# Patient Record
Sex: Male | Born: 1960 | Race: Black or African American | Hispanic: No | Smoking: Current some day smoker
Health system: Southern US, Community
[De-identification: ages and names within clinical notes are randomized; demographics above are authoritative.]

## PROBLEM LIST (undated history)

## (undated) DIAGNOSIS — D649 Anemia, unspecified: Secondary | ICD-10-CM

## (undated) DIAGNOSIS — I10 Essential (primary) hypertension: Secondary | ICD-10-CM

## (undated) DIAGNOSIS — Z8619 Personal history of other infectious and parasitic diseases: Secondary | ICD-10-CM

## (undated) DIAGNOSIS — N200 Calculus of kidney: Secondary | ICD-10-CM

## (undated) DIAGNOSIS — K219 Gastro-esophageal reflux disease without esophagitis: Secondary | ICD-10-CM

## (undated) DIAGNOSIS — I480 Paroxysmal atrial fibrillation: Secondary | ICD-10-CM

## (undated) HISTORY — DX: Personal history of other infectious and parasitic diseases: Z86.19

## (undated) HISTORY — DX: Calculus of kidney: N20.0

## (undated) HISTORY — DX: Anemia, unspecified: D64.9

## (undated) HISTORY — DX: Essential (primary) hypertension: I10

## (undated) HISTORY — PX: TONSILLECTOMY: SUR1361

## (undated) HISTORY — DX: Gastro-esophageal reflux disease without esophagitis: K21.9

## (undated) HISTORY — DX: Paroxysmal atrial fibrillation: I48.0

## (undated) HISTORY — PX: PENILE PROSTHESIS IMPLANT: SHX240

## (undated) HISTORY — PX: HERNIA REPAIR: SHX51

---

## 1998-03-26 ENCOUNTER — Inpatient Hospital Stay (HOSPITAL_COMMUNITY): Admission: EM | Admit: 1998-03-26 | Discharge: 1998-03-28 | Payer: Self-pay | Admitting: Emergency Medicine

## 1998-10-02 ENCOUNTER — Emergency Department (HOSPITAL_COMMUNITY): Admission: EM | Admit: 1998-10-02 | Discharge: 1998-10-02 | Payer: Self-pay | Admitting: Emergency Medicine

## 1998-11-27 ENCOUNTER — Encounter: Payer: Self-pay | Admitting: Emergency Medicine

## 1998-11-27 ENCOUNTER — Emergency Department (HOSPITAL_COMMUNITY): Admission: EM | Admit: 1998-11-27 | Discharge: 1998-11-27 | Payer: Self-pay | Admitting: Emergency Medicine

## 1998-12-13 ENCOUNTER — Emergency Department (HOSPITAL_COMMUNITY): Admission: EM | Admit: 1998-12-13 | Discharge: 1998-12-13 | Payer: Self-pay | Admitting: Emergency Medicine

## 1999-01-06 ENCOUNTER — Ambulatory Visit (HOSPITAL_BASED_OUTPATIENT_CLINIC_OR_DEPARTMENT_OTHER): Admission: RE | Admit: 1999-01-06 | Discharge: 1999-01-06 | Payer: Self-pay | Admitting: Surgery

## 1999-09-25 ENCOUNTER — Emergency Department (HOSPITAL_COMMUNITY): Admission: EM | Admit: 1999-09-25 | Discharge: 1999-09-25 | Payer: Self-pay | Admitting: Emergency Medicine

## 2000-09-19 ENCOUNTER — Encounter: Payer: Self-pay | Admitting: Emergency Medicine

## 2000-09-19 ENCOUNTER — Emergency Department (HOSPITAL_COMMUNITY): Admission: EM | Admit: 2000-09-19 | Discharge: 2000-09-19 | Payer: Self-pay | Admitting: Emergency Medicine

## 2000-10-14 ENCOUNTER — Encounter: Payer: Self-pay | Admitting: Emergency Medicine

## 2000-10-14 ENCOUNTER — Emergency Department (HOSPITAL_COMMUNITY): Admission: EM | Admit: 2000-10-14 | Discharge: 2000-10-14 | Payer: Self-pay | Admitting: Emergency Medicine

## 2002-06-23 ENCOUNTER — Observation Stay (HOSPITAL_COMMUNITY): Admission: EM | Admit: 2002-06-23 | Discharge: 2002-06-23 | Payer: Self-pay | Admitting: Emergency Medicine

## 2002-06-23 ENCOUNTER — Encounter: Payer: Self-pay | Admitting: Emergency Medicine

## 2003-06-11 ENCOUNTER — Emergency Department (HOSPITAL_COMMUNITY): Admission: EM | Admit: 2003-06-11 | Discharge: 2003-06-11 | Payer: Self-pay | Admitting: Emergency Medicine

## 2003-12-27 ENCOUNTER — Emergency Department (HOSPITAL_COMMUNITY): Admission: EM | Admit: 2003-12-27 | Discharge: 2003-12-27 | Payer: Self-pay | Admitting: Emergency Medicine

## 2004-05-16 ENCOUNTER — Inpatient Hospital Stay (HOSPITAL_COMMUNITY): Admission: EM | Admit: 2004-05-16 | Discharge: 2004-05-17 | Payer: Self-pay | Admitting: Emergency Medicine

## 2004-05-16 ENCOUNTER — Encounter (INDEPENDENT_AMBULATORY_CARE_PROVIDER_SITE_OTHER): Payer: Self-pay | Admitting: Cardiology

## 2005-12-19 ENCOUNTER — Ambulatory Visit (HOSPITAL_COMMUNITY): Admission: RE | Admit: 2005-12-19 | Discharge: 2005-12-20 | Payer: Self-pay | Admitting: Urology

## 2006-04-04 ENCOUNTER — Inpatient Hospital Stay (HOSPITAL_COMMUNITY): Admission: EM | Admit: 2006-04-04 | Discharge: 2006-04-05 | Payer: Self-pay | Admitting: Emergency Medicine

## 2008-03-17 ENCOUNTER — Inpatient Hospital Stay (HOSPITAL_COMMUNITY): Admission: EM | Admit: 2008-03-17 | Discharge: 2008-03-24 | Payer: Self-pay | Admitting: Emergency Medicine

## 2008-03-19 ENCOUNTER — Encounter (INDEPENDENT_AMBULATORY_CARE_PROVIDER_SITE_OTHER): Payer: Self-pay | Admitting: Internal Medicine

## 2009-01-09 IMAGING — CT CT HEAD W/O CM
2 series · 16 of 30 positions shown, 20 images · non-contrast
Comparison: None.

CLINICAL DATA: Chest pain, dizziness, unsteady gait

CT HEAD WITHOUT CONTRAST
TECHNIQUE: Contiguous axial images were obtained from the base of
the skull through the vertex without contrast.

[Series 2: brain · axial · 0.47mm/px · z∈[+149,+271]mm · 13 of 44 slices shown, 17 images]
[im 4/44  brain]
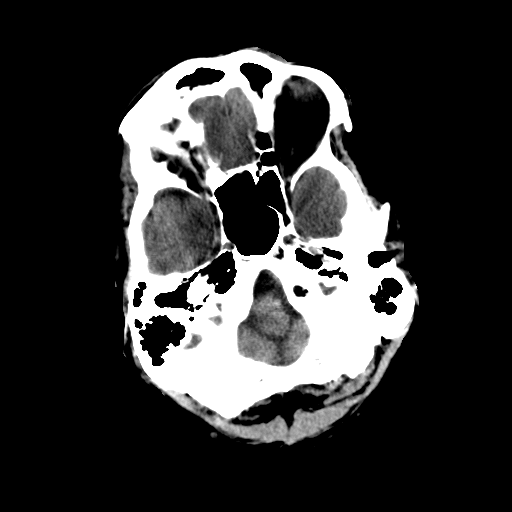
[im 4/44  bone]
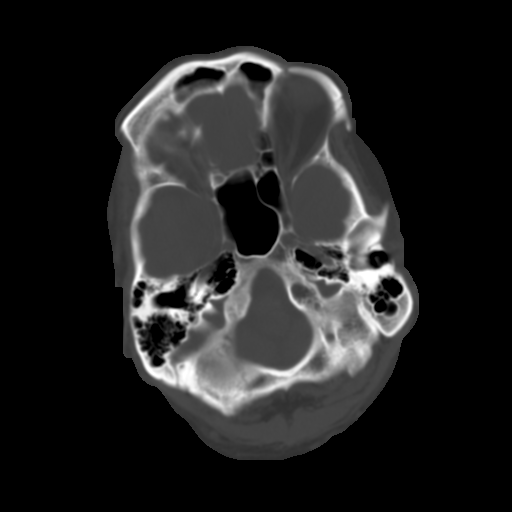
[im 7/44  brain]
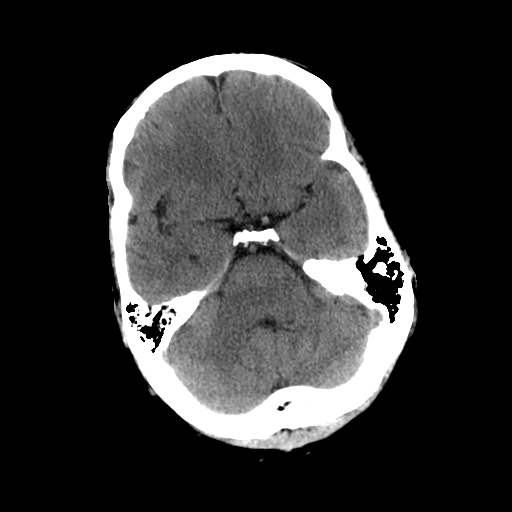
[im 10/44  brain]
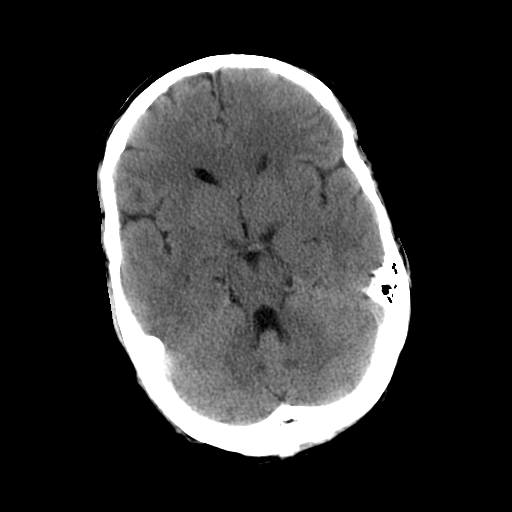
[im 13/44  brain]
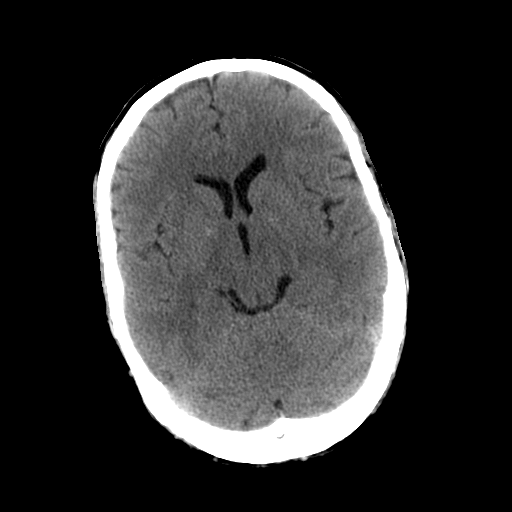
[im 16/44  brain]
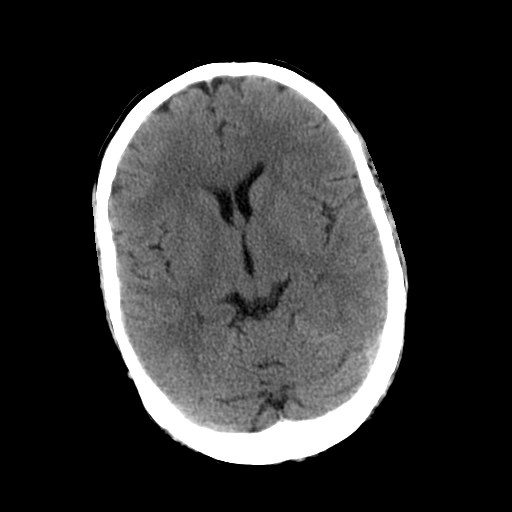
[im 16/44  bone]
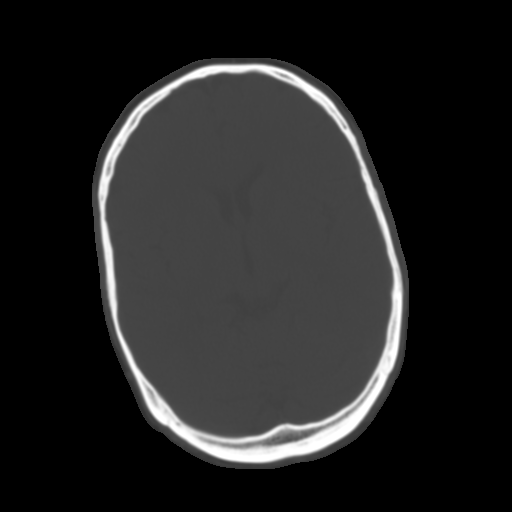
[im 19/44  brain]
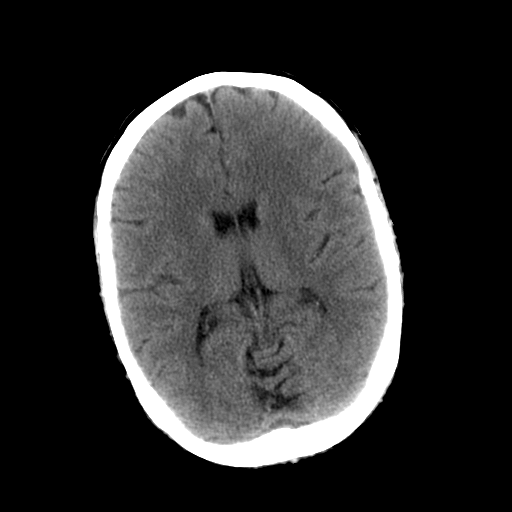
[im 22/44  brain]
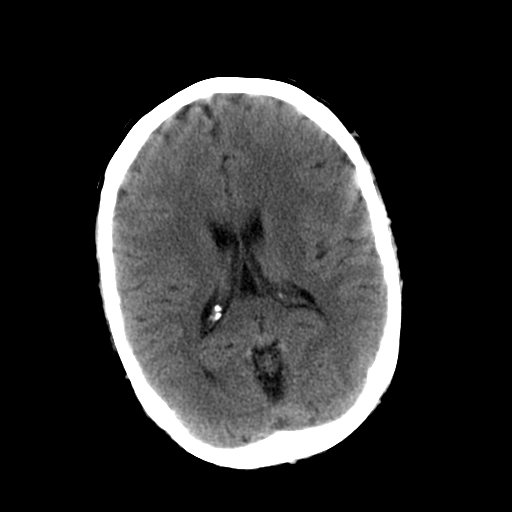
[im 25/44  brain]
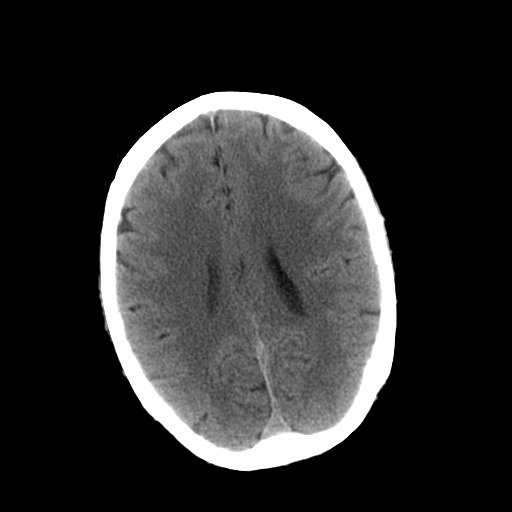
[im 28/44  brain]
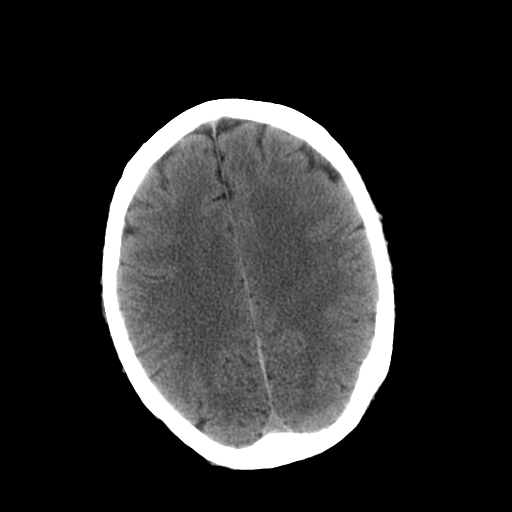
[im 28/44  bone]
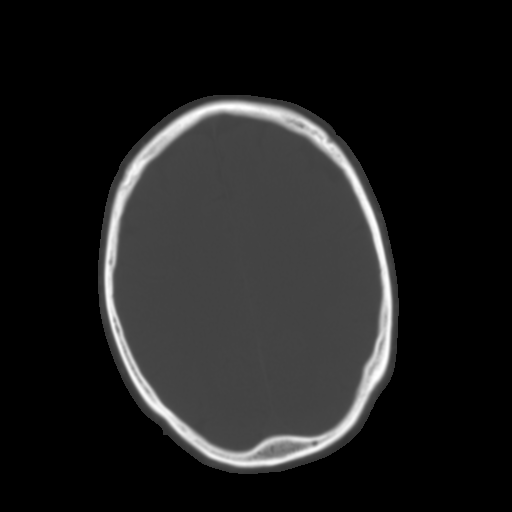
[im 31/44  brain]
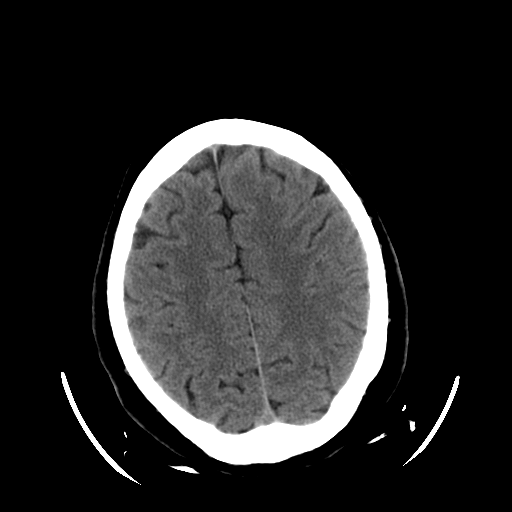
[im 34/44  brain]
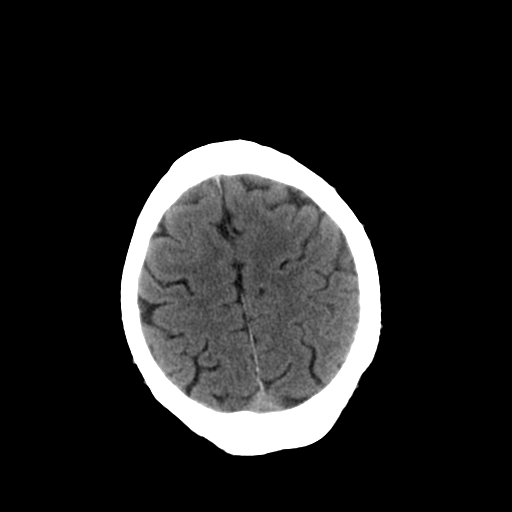
[im 37/44  brain]
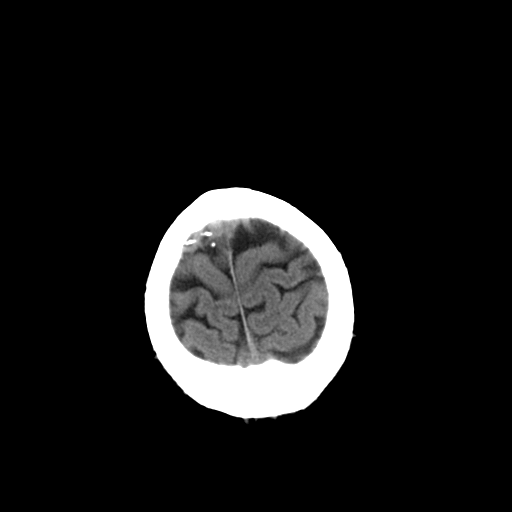
[im 40/44  brain]
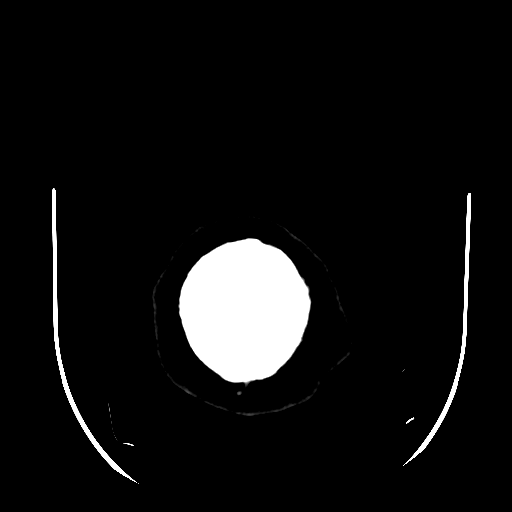
[im 40/44  bone]
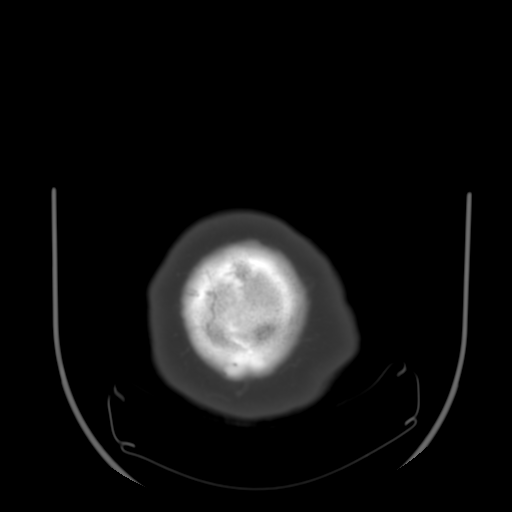

[Series 3: recon 2: brain · axial · 0.47mm/px · z∈[+149,+189]mm · 3 of 44 slices shown]
[im 4/44  brain]
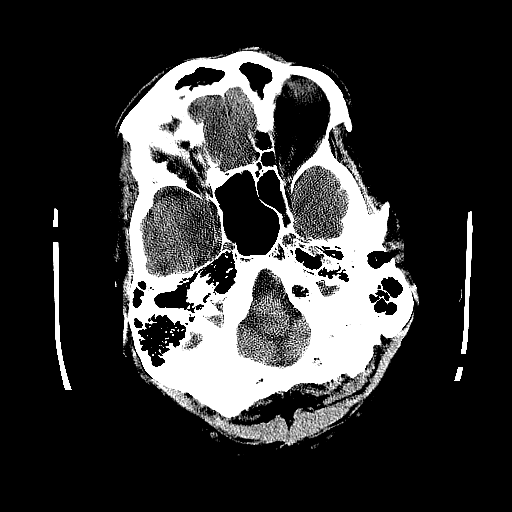
[im 10/44  brain]
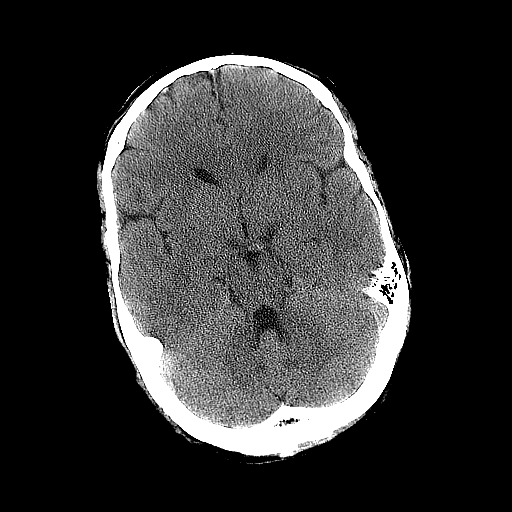
[im 16/44  brain]
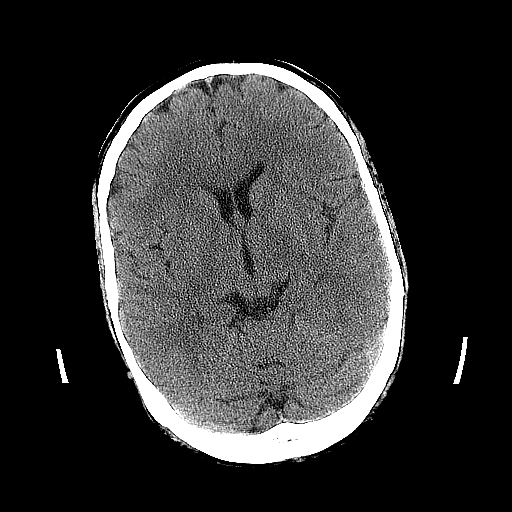

[16 of 30 positions shown; findings below may reference images not displayed]

FINDINGS: No acute intracranial hemorrhage, definite infarction,
mass lesion, midline shift, focal edema, hydrocephalus, herniation
or extra-axial fluid collection.  Gray and white matter
differentiation is maintained.  Cisterns are patent.  The mastoids
and sinuses are clear.
IMPRESSION: No acute intracranial finding.

## 2009-12-11 ENCOUNTER — Emergency Department (HOSPITAL_COMMUNITY): Admission: EM | Admit: 2009-12-11 | Discharge: 2009-12-11 | Payer: Self-pay | Admitting: Emergency Medicine

## 2010-06-26 ENCOUNTER — Observation Stay (HOSPITAL_COMMUNITY): Admission: EM | Admit: 2010-06-26 | Discharge: 2010-06-27 | Payer: Self-pay | Admitting: Emergency Medicine

## 2010-07-06 ENCOUNTER — Ambulatory Visit: Payer: Self-pay | Admitting: Internal Medicine

## 2010-07-06 ENCOUNTER — Encounter: Payer: Self-pay | Admitting: Gastroenterology

## 2010-07-06 DIAGNOSIS — I959 Hypotension, unspecified: Secondary | ICD-10-CM | POA: Insufficient documentation

## 2010-07-06 DIAGNOSIS — D509 Iron deficiency anemia, unspecified: Secondary | ICD-10-CM | POA: Insufficient documentation

## 2010-07-06 DIAGNOSIS — K921 Melena: Secondary | ICD-10-CM | POA: Insufficient documentation

## 2010-07-06 LAB — CONVERTED CEMR LAB
Basophils Absolute: 0.1 K/uL
Basophils Relative: 1 %
Cortisol, Plasma: 16 ug/dL
Eosinophils Absolute: 0.1 K/uL
Eosinophils Relative: 1.9 %
HCT: 36.7 % — ABNORMAL LOW
Hemoglobin: 12.5 g/dL — ABNORMAL LOW
Lymphocytes Relative: 31 %
Lymphs Abs: 1.9 K/uL
MCHC: 34.2 g/dL
MCV: 88.7 fL
Monocytes Absolute: 0.7 K/uL
Monocytes Relative: 11.7 %
Neutro Abs: 3.3 K/uL
Neutrophils Relative %: 54.4 %
PSA: 1.13 ng/mL
Platelets: 258 K/uL
RBC: 4.14 M/uL — ABNORMAL LOW
RDW: 16 % — ABNORMAL HIGH
WBC: 6.1 10*3/microliter

## 2010-07-09 ENCOUNTER — Ambulatory Visit: Payer: Self-pay | Admitting: Internal Medicine

## 2010-07-10 ENCOUNTER — Inpatient Hospital Stay (HOSPITAL_COMMUNITY): Admission: EM | Admit: 2010-07-10 | Discharge: 2010-07-11 | Payer: Self-pay | Admitting: Emergency Medicine

## 2010-07-10 ENCOUNTER — Encounter (INDEPENDENT_AMBULATORY_CARE_PROVIDER_SITE_OTHER): Payer: Self-pay | Admitting: Cardiology

## 2010-07-26 ENCOUNTER — Encounter (INDEPENDENT_AMBULATORY_CARE_PROVIDER_SITE_OTHER): Payer: Self-pay | Admitting: *Deleted

## 2010-07-31 ENCOUNTER — Ambulatory Visit: Payer: Self-pay | Admitting: Gastroenterology

## 2010-08-09 ENCOUNTER — Telehealth (INDEPENDENT_AMBULATORY_CARE_PROVIDER_SITE_OTHER): Payer: Self-pay | Admitting: *Deleted

## 2010-08-14 ENCOUNTER — Ambulatory Visit: Payer: Self-pay | Admitting: Gastroenterology

## 2010-08-14 LAB — HM COLONOSCOPY: HM Colonoscopy: NORMAL

## 2010-08-21 ENCOUNTER — Telehealth: Payer: Self-pay | Admitting: Gastroenterology

## 2010-08-21 ENCOUNTER — Encounter: Payer: Self-pay | Admitting: Gastroenterology

## 2010-09-08 ENCOUNTER — Encounter (INDEPENDENT_AMBULATORY_CARE_PROVIDER_SITE_OTHER): Payer: Self-pay | Admitting: *Deleted

## 2010-09-12 ENCOUNTER — Ambulatory Visit: Payer: Self-pay | Admitting: Gastroenterology

## 2010-09-19 ENCOUNTER — Encounter: Payer: Self-pay | Admitting: Internal Medicine

## 2010-09-19 ENCOUNTER — Ambulatory Visit (HOSPITAL_COMMUNITY): Admission: RE | Admit: 2010-09-19 | Discharge: 2010-09-19 | Payer: Self-pay | Admitting: Gastroenterology

## 2010-09-19 ENCOUNTER — Ambulatory Visit: Payer: Self-pay | Admitting: Gastroenterology

## 2010-09-20 ENCOUNTER — Telehealth: Payer: Self-pay | Admitting: Gastroenterology

## 2010-09-25 ENCOUNTER — Telehealth: Payer: Self-pay | Admitting: Internal Medicine

## 2010-09-26 ENCOUNTER — Ambulatory Visit: Payer: Self-pay | Admitting: Internal Medicine

## 2010-09-26 DIAGNOSIS — B3781 Candidal esophagitis: Secondary | ICD-10-CM | POA: Insufficient documentation

## 2010-09-26 LAB — CONVERTED CEMR LAB
Albumin: 3.7 g/dL (ref 3.5–5.2)
Alkaline Phosphatase: 49 units/L (ref 39–117)
Eosinophils Absolute: 0.1 10*3/uL (ref 0.0–0.7)
HCT: 36.3 % — ABNORMAL LOW (ref 39.0–52.0)
Hemoglobin: 12.3 g/dL — ABNORMAL LOW (ref 13.0–17.0)
Lymphocytes Relative: 21.3 % (ref 12.0–46.0)
Neutrophils Relative %: 66.7 % (ref 43.0–77.0)
Total Protein: 6.7 g/dL (ref 6.0–8.3)
WBC: 7.6 10*3/uL (ref 4.5–10.5)

## 2010-09-27 ENCOUNTER — Encounter: Payer: Self-pay | Admitting: Internal Medicine

## 2010-10-02 ENCOUNTER — Encounter: Payer: Self-pay | Admitting: Gastroenterology

## 2010-10-05 ENCOUNTER — Ambulatory Visit: Payer: Self-pay | Admitting: Gastroenterology

## 2010-10-05 LAB — CONVERTED CEMR LAB
OCCULT 2: NEGATIVE
OCCULT 3: NEGATIVE
OCCULT 4: NEGATIVE

## 2010-10-09 ENCOUNTER — Telehealth: Payer: Self-pay | Admitting: Gastroenterology

## 2010-10-18 ENCOUNTER — Telehealth: Payer: Self-pay | Admitting: Gastroenterology

## 2010-10-19 ENCOUNTER — Ambulatory Visit: Payer: Self-pay | Admitting: Gastroenterology

## 2010-10-19 LAB — CONVERTED CEMR LAB
Basophils Absolute: 0.1 10*3/uL (ref 0.0–0.1)
Eosinophils Relative: 1.8 % (ref 0.0–5.0)
Hemoglobin: 12.3 g/dL — ABNORMAL LOW (ref 13.0–17.0)
Lymphocytes Relative: 27.2 % (ref 12.0–46.0)
Lymphs Abs: 1.7 10*3/uL (ref 0.7–4.0)
MCHC: 34 g/dL (ref 30.0–36.0)
Monocytes Relative: 12.9 % — ABNORMAL HIGH (ref 3.0–12.0)
Platelets: 203 10*3/uL (ref 150.0–400.0)
RDW: 15.4 % — ABNORMAL HIGH (ref 11.5–14.6)

## 2010-10-30 ENCOUNTER — Emergency Department (HOSPITAL_COMMUNITY)
Admission: EM | Admit: 2010-10-30 | Discharge: 2010-10-30 | Payer: Self-pay | Source: Home / Self Care | Admitting: Emergency Medicine

## 2010-11-09 ENCOUNTER — Ambulatory Visit
Admission: RE | Admit: 2010-11-09 | Discharge: 2010-11-09 | Payer: Self-pay | Source: Home / Self Care | Attending: Gastroenterology | Admitting: Gastroenterology

## 2010-11-28 NOTE — Progress Notes (Signed)
Summary: Direct EGD/Enteroscopy  Phone Note Call from Patient Call back at Home Phone [phone]   Caller: Patient Call For: Dr. Arlyce Dice Reason for Call: Talk to Nurse Summary of Call: Wants to know if he can sch'd Endo directly? Initial call taken by: Karna Christmas,  August 21, 2010 10:13 AM  Follow-up for Phone Call        Endo with Entreroscopy scheduled for 09/19/2010 at 12:30pm. Previsit scheduled on 09/12/2010 at 2pm. Follow-up by: Merri Ray CMA (AAMA),  August 21, 2010 11:09 AM

## 2010-11-28 NOTE — Procedures (Signed)
Summary: Colonoscopy  Patient: [name] Note: All result statuses are Final unless otherwise noted.  Tests: (1) Colonoscopy (COL)   COL Colonoscopy           DONE     [facility]     [address]     [place], [place]  [address]           COLONOSCOPY PROCEDURE REPORT           PATIENT:  Roy, Lyons  MR#:  [id number]     BIRTHDATE:  May 21, 1961, 49 yrs. old  GENDER:  male           ENDOSCOPIST:  Roy Hair. Arlyce Dice, MD     Referred by:  [name], M.D.           PROCEDURE DATE:  08/14/2010     PROCEDURE:  Diagnostic Colonoscopy     ASA CLASS:  Class II     INDICATIONS:  1) heme positive stool  2) Iron deficiency anemia           MEDICATIONS:   Fentanyl 75 mcg IV, Versed 7 mg IV           DESCRIPTION OF PROCEDURE:   After the risks benefits and     alternatives of the procedure were thoroughly explained, informed     consent was obtained.  Digital rectal exam was performed and     revealed no abnormalities.   The LB 180AL [id number] endoscope was     introduced through the anus and advanced to the cecum, which was     identified by both the appendix and ileocecal valve, without     limitations.  The quality of the prep was excellent, using     MiraLax.  The instrument was then slowly withdrawn as the colon     was fully examined.     <<PROCEDUREIMAGES>>           FINDINGS:  A normal appearing cecum, ileocecal valve, and     appendiceal orifice were identified. The ascending, hepatic     flexure, transverse, splenic flexure, descending, sigmoid colon,     and rectum appeared unremarkable (see image1, image2, image3,     image8, and image9).   Retroflexed views in the rectum revealed no     abnormalities.    The time to cecum =  3.50  minutes. The scope     was then withdrawn (time =  7.0  min) from the patient and the     procedure completed.           COMPLICATIONS:  None           ENDOSCOPIC IMPRESSION:     1) Normal colon     RECOMMENDATIONS:     1)  Upper endoscopy/enteroscopy  will be scheduled for     unexplained Fe deficiency anemia and heme positive stool           REPEAT EXAM:  In 10 year(s) for Colonoscopy.           ______________________________     Roy Hair. Arlyce Dice, MD           CC:           n.     eSIGNED:   Barbette Hair. Lyons at 08/14/2010 02:08 PM           [name], [id number]  Note: An exclamation mark (!) indicates a result that was not dispersed  into the flowsheet. Document Creation Date: 08/14/2010 2:09 PM _______________________________________________________________________  (1) Order result status: Final Collection or observation date-time: 08/14/2010 14:05 Requested date-time:  Receipt date-time:  Reported date-time:  Referring Physician:   Ordering Physician: [name] [phone]) Specimen Source:  Source: [name] Order Number: [id number] Lab site:   Appended Document: Colonoscopy    Clinical Lists Changes  Observations: Added new observation of COLONNXTDUE: 07/2020 (08/14/2010 15:40)      Appended Document: Colonoscopy EGD/SMALL BOWEL ENTEROSCOPY SCHEDULED FOR 09/19/2010 AT 12:30.  PREVISIT 09/12/2010 AT 2PM

## 2010-11-28 NOTE — Letter (Signed)
Summary: Moviprep Instructions  [facility]  [address]   [place], [place] [address]   Phone: [phone]  Fax: [phone]       [name]    06/10/1961    MRN: [id number]        Procedure Day Dorna Bloom: Monday, 08-14-10     Arrival Time: 12:30 p.m.      Procedure Time: 1:30 p.m.     Location of Procedure:                    x   [facility] (4th Floor)   PREPARATION FOR COLONOSCOPY WITH MOVIPREP   Starting 5 days prior to your procedure 08-09-10 do not eat nuts, seeds, popcorn, corn, beans, peas,  salads, or any raw vegetables.  Do not take any fiber supplements (e.g. Metamucil, Citrucel, and Benefiber).  THE DAY BEFORE YOUR PROCEDURE         DATE:  08-13-10   DAY: Sunday  1.  Drink clear liquids the entire day-NO SOLID FOOD  2.  Do not drink anything colored red or purple.  Avoid juices with pulp.  No orange juice.  3.  Drink at least 64 oz. (8 glasses) of fluid/clear liquids during the day to prevent dehydration and help the prep work efficiently.  CLEAR LIQUIDS INCLUDE: Water Jello Ice Popsicles Tea (sugar ok, no milk/cream) Powdered fruit flavored drinks Coffee (sugar ok, no milk/cream) Gatorade Juice: apple, white grape, white cranberry  Lemonade Clear bullion, consomm, broth Carbonated beverages (any kind) Strained chicken noodle soup Hard Candy                             4.  In the morning, mix first dose of MoviPrep solution:    Empty 1 Pouch A and 1 Pouch B into the disposable container    Add lukewarm drinking water to the top line of the container. Mix to dissolve    Refrigerate (mixed solution should be used within 24 hrs)  5.  Begin drinking the prep at 5:00 p.m. The MoviPrep container is divided by 4 marks.   Every 15 minutes drink the solution down to the next mark (approximately 8 oz) until the full liter is complete.   6.  Follow completed prep with 16 oz of clear liquid of your choice (Nothing red or  purple).  Continue to drink clear liquids until bedtime.  7.  Before going to bed, mix second dose of MoviPrep solution:    Empty 1 Pouch A and 1 Pouch B into the disposable container    Add lukewarm drinking water to the top line of the container. Mix to dissolve    Refrigerate  THE DAY OF YOUR PROCEDURE      DATE: 08-14-10  DAY: Monday  Beginning at 8:30 a.m. (5 hours before procedure):         1. Every 15 minutes, drink the solution down to the next mark (approx 8 oz) until the full liter is complete.  2. Follow completed prep with 16 oz. of clear liquid of your choice.    3. You may drink clear liquids until 11:30 a.m.  (2 HOURS BEFORE PROCEDURE).   MEDICATION INSTRUCTIONS  Unless otherwise instructed, you should take regular prescription medications with a small sip of water   as early as possible the morning of your procedure.   Additional medication instructions: Do not take Iron supplement 5 days before procedure.  OTHER INSTRUCTIONS  You will need a responsible adult at least 51 years of age to accompany you and drive you home.   This person must remain in the waiting room during your procedure.  Wear loose fitting clothing that is easily removed.  Leave jewelry and other valuables at home.  However, you may wish to bring a book to read or  an iPod/MP3 player to listen to music as you wait for your procedure to start.  Remove all body piercing jewelry and leave at home.  Total time from sign-in until discharge is approximately 2-3 hours.  You should go home directly after your procedure and rest.  You can resume normal activities the  day after your procedure.  The day of your procedure you should not:   Drive   Make legal decisions   Operate machinery   Drink alcohol   Return to work  You will receive specific instructions about eating, activities and medications before you leave.    The above instructions have been reviewed and  explained to me by   Ezra Sites RN  July 31, 2010 3:51 PM     I fully understand and can verbalize these instructions _____________________________ Date _________

## 2010-11-28 NOTE — Letter (Signed)
Summary: Pre Visit Letter Revised  [facility]  [address] [place], [place] [address]   Phone: [phone]  Fax: [phone]        07/06/2010 MRN: [id number] [name] [address] [place], [place]  [address]             Procedure Date:  Oct 17 at 1:30 pm   Welcome to the Gastroenterology Division at [facility].    You are scheduled to see a nurse for your pre-procedure visit on Jul 31, 2010 at 3:30pm on the 3rd floor at [facility], [address]  We ask that you try to arrive at our office 15 minutes prior to your appointment time to allow for check-in.  Please take a minute to review the attached form.  If you answer "Yes" to one or more of the questions on the first page, we ask that you call the person listed at your earliest opportunity.  If you answer "No" to all of the questions, please complete the rest of the form and bring it to your appointment.    Your nurse visit will consist of discussing your medical and surgical history, your immediate family medical history, and your medications.   If you are unable to list all of your medications on the form, please bring the medication bottles to your appointment and we will list them.  We will need to be aware of both prescribed and over the counter drugs.  We will need to know exact dosage information as well.    Please be prepared to read and sign documents such as consent forms, a financial agreement, and acknowledgement forms.  If necessary, and with your consent, a friend or relative is welcome to sit-in on the nurse visit with you.  Please bring your insurance card so that we may make a copy of it.  If your insurance requires a referral to see a specialist, please bring your referral form from your primary care physician.  No co-pay is required for this nurse visit.     If you cannot keep your appointment, please call [phone] to cancel or reschedule prior to your appointment date.   This allows [place] the opportunity to schedule an appointment for another patient in need of care.    Thank you for choosing [facility] for your medical needs.  We appreciate the opportunity to care for you.  Please visit [place] at our website  to learn more about our practice.  Sincerely, The Gastroenterology Division

## 2010-11-28 NOTE — Letter (Signed)
Summary: EGD Instructions  [facility]  [address] [place], [place] [address]   Phone: [phone]  Fax: [phone]       [name]    1961/05/07    MRN: [id number]       Procedure Day /Date: Tuesday 09-19-10      Arrival Time:  11:30 p.m.     Procedure Time: 12:30 p.m.     Location of Procedure:                     _ x _ [facility] ( Outpatient Registration)    PREPARATION FOR ENDOSCOPY   On Tuesday 09-19-10 , THE DAY OF THE PROCEDURE:  1.   No solid foods, milk or milk products are allowed after midnight the night before your procedure.  2.   Do not drink anything colored red or purple.  Avoid juices with pulp.  No orange juice.  3.  You may drink clear liquids until 8:30 a.m. , which is 4 hours before your procedure.                                                                                                CLEAR LIQUIDS INCLUDE: Water Jello Ice Popsicles Tea (sugar ok, no milk/cream) Powdered fruit flavored drinks Coffee (sugar ok, no milk/cream) Gatorade Juice: apple, white grape, white cranberry  Lemonade Clear bullion, consomm, broth Carbonated beverages (any kind) Strained chicken noodle soup Hard Candy   MEDICATION INSTRUCTIONS  Unless otherwise instructed, you should take regular prescription medications with a small sip of water as early as possible the morning of your procedure.   Additional medication instructions: Stop taking Iron 5 days before procedure.             OTHER INSTRUCTIONS  You will need a responsible adult at least 51 years of age to accompany you and drive you home.   This person must remain in the waiting room during your procedure.  Wear loose fitting clothing that is easily removed.  Leave jewelry and other valuables at home.  However, you may wish to bring a book to read or an iPod/MP3 player to listen to music as you wait for your procedure to start.  Remove all body piercing  jewelry and leave at home.  Total time from sign-in until discharge is approximately 2-3 hours.  You should go home directly after your procedure and rest.  You can resume normal activities the day after your procedure.  The day of your procedure you should not:   Drive   Make legal decisions   Operate machinery   Drink alcohol   Return to work  You will receive specific instructions about eating, activities and medications before you leave.    The above instructions have been reviewed and explained to me by   Ezra Sites RN  September 12, 2010 2:15 PM    I fully understand and can verbalize these instructions _____________________________ Date _________

## 2010-11-28 NOTE — Miscellaneous (Signed)
  Clinical Lists Changes  Medications: Added new medication of NEXIUM 40 MG CPDR (ESOMEPRAZOLE MAGNESIUM) take 1 tab 1/2 before breakfrast - Signed Rx of NEXIUM 40 MG CPDR (ESOMEPRAZOLE MAGNESIUM) take 1 tab 1/2 before breakfrast;  #30 x 2;  Signed;  Entered by: Louis Meckel MD;  Authorized by: Louis Meckel MD;  Method [facility]: [facility]  [address] [facility] [address] [phone]*, [address], [place], [place], [place]  [id number], Ph: [phone] or [phone], Fax: [phone]    Prescriptions: NEXIUM 40 MG CPDR (ESOMEPRAZOLE MAGNESIUM) take 1 tab 1/2 before breakfrast  #30 x 2   Entered and Authorized by:   Louis Meckel MD   Signed by:   Louis Meckel MD on 09/19/2010   Method used:   [facility]        [facility]  [address] [phone]* (retail)       [address]       [place], [place]  [id number]       Ph: [phone] or [phone]       Fax: [phone]   RxID:   [id number]

## 2010-11-28 NOTE — Progress Notes (Signed)
Summary: procedure f/u ?'s  Phone Note Call from Patient Call back at Home Phone [phone]   Caller: Patient Call For: Dr. Arlyce Dice Reason for Call: Talk to Nurse Summary of Call: has ?'s regarding yesterday's EGD Initial call taken by: Vallarie Mare,  September 20, 2010 8:42 AM  Follow-up for Phone Call        Left message to call back Selinda Michaels RN  September 20, 2010 12:34 PM  Additional Follow-up for Phone Call Additional follow up Details #1::        Pt needs 4 week office appointment follow up and CBC drawn in 4 weeks and hemoccults need to be done. Waiting on pt to return [place] call Additional Follow-up by: Merri Ray CMA Duncan Dull),  September 20, 2010 1:21 PM    Additional Follow-up for Phone Call Additional follow up Details #2::    Spoke with pt, his follow upo appointment is scheduled on 11/09/2010 at 10:15am, he is to come into the basement for labs on 10/19/2010. Will mail out hemoccult cards today for pt, Went over instructions with pt. Follow-up by: Merri Ray CMA Duncan Dull),  September 25, 2010 8:54 AM   Appended Document: procedure f/u ?'s Pt stated he was having no problems, explained to pt to call back if he has any further concerns or questions

## 2010-11-28 NOTE — Letter (Signed)
Summary: New Patient letter  [facility]  [address] [place], [place] [address]   Phone: [phone]  Fax: [phone]       10/02/2010 MRN: [id number]  [name] [address] [place], [place]  [address]  Dear [name],  Welcome to the Gastroenterology Division at [facility].    You are scheduled to see Dr.  Arlyce Dice on 11-09-10 at 10:15am on the 3rd floor at [facility], [address].  We ask that you try to arrive at our office 15 minutes prior to your appointment time to allow for check-in.  We would like you to complete the enclosed self-administered evaluation form prior to your visit and bring it with you on the day of your appointment.  We will review it with you.  Also, please bring a complete list of all your medications or, if you prefer, bring the medication bottles and we will list them.  Please bring your insurance card so that we may make a copy of it.  If your insurance requires a referral to see a specialist, please bring your referral form from your primary care physician.  Co-payments are due at the time of your visit and may be paid by cash, check or credit card.     Your office visit will consist of a consult with your physician (includes a physical exam), any laboratory testing he/she may order, scheduling of any necessary diagnostic testing (e.g. x-ray, ultrasound, CT-scan), and scheduling of a procedure (e.g. Endoscopy, Colonoscopy) if required.  Please allow enough time on your schedule to allow for any/all of these possibilities.    If you cannot keep your appointment, please call [phone] to cancel or reschedule prior to your appointment date.  This allows [place] the opportunity to schedule an appointment for another patient in need of care.  If you do not cancel or reschedule by 5 p.m. the business day prior to your appointment date, you will be charged a $50.00 late cancellation/no-show fee.    Thank you for choosing [facility] for your medical needs.  We appreciate the opportunity to care for you.  Please visit [place] at our website  to learn more about our practice.                     Sincerely,                                                             The Gastroenterology Division

## 2010-11-28 NOTE — Progress Notes (Signed)
Summary: Questions about prep  Phone Note Call from Patient Call back at Home Phone [phone]   Caller: Patient Call For: Dr. Arlyce Dice Reason for Call: Talk to Nurse Summary of Call: should pt. stop his iron supplement and vitamins Initial call taken by: Karna Christmas,  August 09, 2010 9:52 AM  Follow-up for Phone Call        Phone Call Completed Follow-up by: Wyona Almas RN,  August 09, 2010 10:27 AM

## 2010-11-28 NOTE — Miscellaneous (Signed)
Summary: [facility] PV  Clinical Lists Changes  Medications: Added new medication of MOVIPREP 100 GM  SOLR (PEG-KCL-NACL-NASULF-NA ASC-C) As per prep instructions. - Signed Rx of MOVIPREP 100 GM  SOLR (PEG-KCL-NACL-NASULF-NA ASC-C) As per prep instructions.;  #1 x 0;  Signed;  Entered by: Ezra Sites RN;  Authorized by: Louis Meckel MD;  Method used: [facility] [facility] [address] [phone]*, [address] [place], [place], [place]  [id number], Ph: [phone] or [phone], Fax: [phone] Observations: Added new observation of NKA: T (07/31/2010 14:52)    Prescriptions: MOVIPREP 100 GM  SOLR (PEG-KCL-NACL-NASULF-NA ASC-C) As per prep instructions.  #1 x 0   Entered by:   Ezra Sites RN   Authorized by:   Louis Meckel MD   Signed by:   Ezra Sites RN on 07/31/2010   Method used:   Electronically to        [facility]  [address] [phone]* (retail)       [address]       [place], [place]  [id number]       Ph: [phone] or [phone]       Fax: [phone]   RxID:   [id number]

## 2010-11-28 NOTE — Miscellaneous (Signed)
Summary: [facility] PV  Clinical Lists Changes  Observations: Added new observation of NKA: T (09/12/2010 14:05)

## 2010-11-28 NOTE — Assessment & Plan Note (Signed)
Summary: follow up per Dr Yetta Barre   Vital Signs:  Patient profile:   50 year old male Height:      72.5 inches Weight:      221 pounds BMI:     29.67 O2 Sat:      98 % on Room air Temp:     98.4 degrees F oral Pulse rate:   66 / minute Pulse rhythm:   regular Resp:     16 per minute BP sitting:   128 / 86  (left arm) Cuff size:   large  Vitals Entered By: Rock Nephew CMA (September 26, 2010 8:44 AM)  Nutrition Counseling: Patient's BMI is greater than 25 and therefore counseled on weight management options.  O2 Flow:  Room air CC: follow-up visit Is Patient Diabetic? No Pain Assessment Patient in pain? no       Does patient need assistance? Functional Status Self care Ambulation Normal   Primary Care Provider:  Etta Grandchild MD  CC:  follow-up visit.  History of Present Illness: He returns for f/up and was found to have candida eso on his EGD so he needs to have an HIV test done. He denies any HIV risk factors and says that he has never been tested for HIV . He feels well today.  Preventive Screening-Counseling & Management  Alcohol-Tobacco     Alcohol drinks/day: 1glass wine monthly     >5/day in last 3 mos: no     Alcohol Counseling: not indicated; use of alcohol is not excessive or problematic     Feels need to cut down: no     Feels annoyed by complaints: no     Feels guilty re: drinking: no     Needs 'eye opener' in am: no     Smoking Status: current     Smoking Cessation Counseling: yes     Smoke Cessation Stage: precontemplative     Packs/Day: 0.25     Cigars/week: occasional     Tobacco Counseling: to quit use of tobacco products  Hep-HIV-STD-Contraception     Hepatitis Risk: no risk noted     HIV Risk: no risk noted     STD Risk: no risk noted     Dental Visit-last 6 months yes     Dental Care Counseling: to seek dental care; no dental care within six months     TSE monthly: yes     Testicular SE Education/Counseling to perform regular  STE      Sexual History:  currently monogamous.        Drug Use:  never.        Blood Transfusions:  no.    Clinical Review Panels:  Prevention   Last Colonoscopy:  DONE (08/14/2010)   Last PSA:  1.13 (07/06/2010)  CBC   WBC:  6.1 (07/06/2010)   RBC:  4.14 (07/06/2010)   Hgb:  12.5 (07/06/2010)   Hct:  36.7 (07/06/2010)   Platelets:  258.0 (07/06/2010)   MCV  88.7 (07/06/2010)   MCHC  34.2 (07/06/2010)   RDW  16.0 (07/06/2010)   PMN:  54.4 (07/06/2010)   Lymphs:  31.0 (07/06/2010)   Monos:  11.7 (07/06/2010)   Eosinophils:  1.9 (07/06/2010)   Basophil:  1.0 (07/06/2010)   Medications Prior to Update: 1)  Ferretts 325 (106 Fe) Mg Tabs (Ferrous Fumarate) .... Take 1 Tablet By Mouth Two Times A Day. 2)  Aspirin 81 Mg Tabs (Aspirin) .... Take 1 Tablet By Mouth Once A  Day. 3)  Fish Oil 1000 Mg Caps (Omega-3 Fatty Acids) .... Take 1 Capsule By Mouth Once A Day. 4)  Daily Multiple Vitamins  Tabs (Multiple Vitamin) .... Take 1 Tablet By Mouth Once A Day. 5)  Nexium 40 Mg Cpdr (Esomeprazole Magnesium) .... Take 1 Tab 1/2 Before Breakfrast 6)  Fluconazole 100 Mg Tabs (Fluconazole) .... Take 2 By Mouth On Day One Then Take 1 By Mouth Once Daily For 10 Days  Current Medications (verified): 1)  Ferretts 325 (106 Fe) Mg Tabs (Ferrous Fumarate) .... Take 1 Tablet By Mouth Two Times A Day. 2)  Aspirin 81 Mg Tabs (Aspirin) .... Take 1 Tablet By Mouth Once A Day. 3)  Fish Oil 1000 Mg Caps (Omega-3 Fatty Acids) .... Take 1 Capsule By Mouth Once A Day. 4)  Daily Multiple Vitamins  Tabs (Multiple Vitamin) .... Take 1 Tablet By Mouth Once A Day. 5)  Nexium 40 Mg Cpdr (Esomeprazole Magnesium) .... Take 1 Tab 1/2 Before Breakfrast 6)  Fluconazole 100 Mg Tabs (Fluconazole) .... Take 2 By Mouth On Day One Then Take 1 By Mouth Once Daily For 10 Days  Allergies (verified): No Known Drug Allergies  Past History:  Past Medical History: Last updated: 07/06/2010 hypotension Anemia-iron  deficiency  Past Surgical History: Last updated: 07/06/2010 Tonsillectomy  Family History: Last updated: 07/06/2010 Mother--living, diabetes, stroke, heart disease (bypass) Father--living, diabetes, ?cancer 1 sister--diabetes 1 brother-- alive and well  Social History: Last updated: 07/06/2010 Occupation: pastor Single Current Smoker Alcohol use-yes Regular exercise-yes  Risk Factors: Alcohol Use: 1glass wine monthly (09/26/2010) >5 drinks/d w/in last 3 months: no (09/26/2010) Caffeine Use: 1 drink daily (07/06/2010) Exercise: yes (07/06/2010)  Risk Factors: Smoking Status: current (09/26/2010) Packs/Day: 0.25 (09/26/2010) Cigars/wk: occasional (09/26/2010)  Family History: Reviewed history from 07/06/2010 and no changes required. Mother--living, diabetes, stroke, heart disease (bypass) Father--living, diabetes, ?cancer 1 sister--diabetes 1 brother-- alive and well  Social History: Reviewed history from 07/06/2010 and no changes required. Occupation: pastor Single Current Smoker Alcohol use-yes Regular exercise-yes Packs/Day:  0.25  Review of Systems       The patient complains of weight gain.  The patient denies anorexia, fever, weight loss, hoarseness, chest pain, syncope, dyspnea on exertion, peripheral edema, prolonged cough, headaches, hemoptysis, abdominal pain, depression, enlarged lymph nodes, and angioedema.   General:  Denies chills, fatigue, fever, loss of appetite, malaise, sleep disorder, sweats, weakness, and weight loss. GI:  Denies abdominal pain, constipation, diarrhea, indigestion, nausea, and vomiting. Heme:  Denies abnormal bruising, bleeding, enlarge lymph nodes, fevers, pallor, and skin discoloration.  Physical Exam  General:  alert, well-developed, well-nourished, well-hydrated, appropriate dress, normal appearance, healthy-appearing, cooperative to examination, good hygiene, and overweight-appearing.   Head:  normocephalic, atraumatic,  no abnormalities observed, and no abnormalities palpated.   Eyes:  vision grossly intact and no injection.   Mouth:  Oral mucosa and oropharynx without lesions or exudates.  Teeth in good repair. Neck:  supple, full ROM, no masses, no thyromegaly, no thyroid nodules or tenderness, no JVD, normal carotid upstroke, no carotid bruits, no cervical lymphadenopathy, and no neck tenderness.   Lungs:  Normal respiratory effort, chest expands symmetrically. Lungs are clear to auscultation, no crackles or wheezes. Heart:  Normal rate and regular rhythm. S1 and S2 normal without gallop, murmur, click, rub or other extra sounds. Abdomen:  soft, non-tender, normal bowel sounds, no distention, no masses, no guarding, no rigidity, no rebound tenderness, no abdominal hernia, no inguinal hernia, no hepatomegaly, and no splenomegaly.  Msk:  normal ROM, no joint tenderness, no joint swelling, no joint warmth, no redness over joints, no joint deformities, no joint instability, and no crepitation.   Pulses:  R and L carotid,radial,femoral,dorsalis pedis and posterior tibial pulses are full and equal bilaterally Extremities:  No clubbing, cyanosis, edema, or deformity noted with normal full range of motion of all joints.   Neurologic:  No cranial nerve deficits noted. Station and gait are normal. Plantar reflexes are down-going bilaterally. DTRs are symmetrical throughout. Sensory, motor and coordinative functions appear intact. Skin:  turgor normal, color normal, no rashes, no suspicious lesions, no ecchymoses, no petechiae, no purpura, no ulcerations, and no edema.   Cervical Nodes:  no anterior cervical adenopathy and no posterior cervical adenopathy.   Psych:  Cognition and judgment appear intact. Alert and cooperative with normal attention span and concentration. No apparent delusions, illusions, hallucinations   Impression & Recommendations:  Problem # 1:  CANDIDIASIS, ESOPHAGEAL (ICD-112.84) Assessment  New continue diflucan Orders: Venipuncture (86578) TLB-CBC Platelet - w/Differential (85025-CBCD) TLB-Hepatic/Liver Function Pnl (80076-HEPATIC) T-HIV Antibody  (Reflex) ([id number])  Problem # 2:  ANEMIA-IRON DEFICIENCY (ICD-280.9) Assessment: Unchanged  His updated medication list for this problem includes:    Ferretts 325 (106 Fe) Mg Tabs (Ferrous fumarate) .Marland Kitchen... Take 1 tablet by mouth two times a day.  Orders: Venipuncture (13244) TLB-CBC Platelet - w/Differential (85025-CBCD) TLB-Hepatic/Liver Function Pnl (80076-HEPATIC) T-HIV Antibody  (Reflex) ([id number])  Hgb: 12.5 (07/06/2010)   Hct: 36.7 (07/06/2010)   Platelets: 258.0 (07/06/2010) RBC: 4.14 (07/06/2010)   RDW: 16.0 (07/06/2010)   WBC: 6.1 (07/06/2010) MCV: 88.7 (07/06/2010)   MCHC: 34.2 (07/06/2010)  Complete Medication List: 1)  Ferretts 325 (106 Fe) Mg Tabs (Ferrous fumarate) .... Take 1 tablet by mouth two times a day. 2)  Aspirin 81 Mg Tabs (Aspirin) .... Take 1 tablet by mouth once a day. 3)  Fish Oil 1000 Mg Caps (Omega-3 fatty acids) .... Take 1 capsule by mouth once a day. 4)  Daily Multiple Vitamins Tabs (Multiple vitamin) .... Take 1 tablet by mouth once a day. 5)  Nexium 40 Mg Cpdr (Esomeprazole magnesium) .... Take 1 tab 1/2 before breakfrast 6)  Fluconazole 100 Mg Tabs (Fluconazole) .... Take 2 by mouth on day one then take 1 by mouth once daily for 10 days  Patient Instructions: 1)  Please schedule a follow-up appointment in 1 month. 2)  Tobacco is very bad for your health and your loved ones! You Should stop smoking!. 3)  Stop Smoking Tips: Choose a Quit date. Cut down before the Quit date. decide what you will do as a substitute when you feel the urge to smoke(gum,toothpick,exercise). 4)  It is important that you exercise regularly at least 20 minutes 5 times a week. If you develop chest pain, have severe difficulty breathing, or feel very tired , stop exercising immediately and seek medical  attention. 5)  You need to lose weight. Consider a lower calorie diet and regular exercise.  6)  If you could be exposed to sexually transmitted diseases, you should use a condom.   Orders Added: 1)  Venipuncture [44034] 2)  TLB-CBC Platelet - w/Differential [85025-CBCD] 3)  TLB-Hepatic/Liver Function Pnl [80076-HEPATIC] 4)  T-HIV Antibody  (Reflex) [[id number]] 5)  Est. Patient Level IV [56433]

## 2010-11-28 NOTE — Letter (Signed)
Summary: Previsit letter  [facility]  [address] [place], [place] [address]   Phone: [phone]  Fax: [phone]       08/21/2010 MRN: [id number]  [name] [address] [place], [place]  [address]  Dear [name],  Welcome to the Gastroenterology Division at [facility].    You are scheduled to see a nurse for your pre-procedure visit on 09/12/2010 at 2:00PM on the 3rd floor at [facility], [address]  We ask that you try to arrive at our office 15 minutes prior to your appointment time to allow for check-in.  Your nurse visit will consist of discussing your medical and surgical history, your immediate family medical history, and your medications.    Please bring a complete list of all your medications or, if you prefer, bring the medication bottles and we will list them.  We will need to be aware of both prescribed and over the counter drugs.  We will need to know exact dosage information as well.  If you are on blood thinners (Coumadin, Plavix, Aggrenox, Ticlid, etc.) please call our office today/prior to your appointment, as we need to consult with your physician about holding your medication.   Please be prepared to read and sign documents such as consent forms, a financial agreement, and acknowledgement forms.  If necessary, and with your consent, a friend or relative is welcome to sit-in on the nurse visit with you.  Please bring your insurance card so that we may make a copy of it.  If your insurance requires a referral to see a specialist, please bring your referral form from your primary care physician.  No co-pay is required for this nurse visit.     If you cannot keep your appointment, please call [phone] to cancel or reschedule prior to your appointment date.  This allows [place] the opportunity to schedule an appointment for another patient in need of care.    Thank you for choosing [facility] for your medical needs.   We appreciate the opportunity to care for you.  Please visit [place] at our website  to learn more about our practice.                     Sincerely.                                                                                                                   The Gastroenterology Division

## 2010-11-28 NOTE — Assessment & Plan Note (Signed)
Summary: New / will bring $184 /#/cd--Rm 8   Vital Signs:  Patient profile:   50 year old male Height:      72.5 inches Weight:      216.08 pounds BMI:     29.01 O2 Sat:      98 % on Room air Temp:     97.0 degrees F oral Pulse rate:   85 / minute Pulse rhythm:   regular Resp:     18 per minute BP sitting:   108 / 70  (left arm) Cuff size:   regular  Vitals Entered By: Mervin Kung CMA Duncan Dull) (July 06, 2010 10:37 AM)  Nutrition Counseling: Patient's BMI is greater than 25 and therefore counseled on weight management options.  O2 Flow:  Room air CC: Rm 8  New pt to establish care. Recently in hospital for hypotension per pt. Is Patient Diabetic? No Pain Assessment Patient in pain? no        Primary Care Provider:  Etta Grandchild MD  CC:  Rm 8  New pt to establish care. Recently in hospital for hypotension per pt..  History of Present Illness: New to me this gentleman was recently admitted to [facility] for a near-syncopal episode. After discharge the hospitalist called me to let me know that his random cortisol level was borderline low and he asked that I recheck it today. The pt. feels well. It was also found that he had iron-deficiency anemia.  Preventive Screening-Counseling & Management  Alcohol-Tobacco     Alcohol drinks/day: 1glass wine monthly     >5/day in last 3 mos: no     Alcohol Counseling: not indicated; use of alcohol is not excessive or problematic     Feels need to cut down: no     Feels annoyed by complaints: no     Feels guilty re: drinking: no     Needs 'eye opener' in am: no     Smoking Status: current     Cigars/week: occasional     Tobacco Counseling: to quit use of tobacco products  Caffeine-Diet-Exercise     Caffeine use/day: 1 drink daily     Does Patient Exercise: yes     Type of exercise: walking, biking     Exercise (avg: min/session): 45 min     Times/week: 3  Hep-HIV-STD-Contraception     Hepatitis Risk: no risk noted     HIV  Risk: no risk noted     STD Risk: no risk noted     Dental Visit-last 6 months yes     Dental Care Counseling: to seek dental care; no dental care within six months     TSE monthly: yes     Testicular SE Education/Counseling to perform regular STE      Sexual History:  currently monogamous.        Drug Use:  never.        Blood Transfusions:  no.    Medications Prior to Update: 1)  None  Current Medications (verified): 1)  Ferretts 325 (106 Fe) Mg Tabs (Ferrous Fumarate) .... Take 1 Tablet By Mouth Two Times A Day. 2)  Aspirin 81 Mg Tabs (Aspirin) .... Take 1 Tablet By Mouth Once A Day. 3)  Fish Oil 1000 Mg Caps (Omega-3 Fatty Acids) .... Take 1 Capsule By Mouth Once A Day. 4)  Daily Multiple Vitamins  Tabs (Multiple Vitamin) .... Take 1 Tablet By Mouth Once A Day.  Allergies (verified): No Known Drug Allergies  Past History:  Family History: Last updated: 07/06/2010 Mother--living, diabetes, stroke, heart disease (bypass) Father--living, diabetes, ?cancer 1 sister--diabetes 1 brother-- alive and well  Risk Factors: Alcohol Use: 1glass wine monthly (07/06/2010) >5 drinks/d w/in last 3 months: no (07/06/2010) Caffeine Use: 1 drink daily (07/06/2010) Exercise: yes (07/06/2010)  Past Medical History: hypotension Anemia-iron deficiency  Past Surgical History: Tonsillectomy  Family History: Reviewed history and no changes required. Mother--living, diabetes, stroke, heart disease (bypass) Father--living, diabetes, ?cancer 1 sister--diabetes 1 brother-- alive and well  Social History: Reviewed history and no changes required. Occupation: Education officer, environmental Single Current Smoker Alcohol use-yes Regular exercise-yes Smoking Status:  current Caffeine use/day:  1 drink daily Does Patient Exercise:  yes Hepatitis Risk:  no risk noted HIV Risk:  no risk noted STD Risk:  no risk noted Dental Care w/in 6 mos.:  yes Sexual History:  currently monogamous Drug Use:   never Blood Transfusions:  no  Review of Systems       The patient complains of weight gain.  The patient denies anorexia, fever, weight loss, chest pain, syncope, dyspnea on exertion, peripheral edema, prolonged cough, headaches, hemoptysis, abdominal pain, melena, hematochezia, severe indigestion/heartburn, hematuria, muscle weakness, suspicious skin lesions, difficulty walking, depression, enlarged lymph nodes, angioedema, and testicular masses.   GI:  Complains of constipation; denies abdominal pain, bloody stools, change in bowel habits, diarrhea, excessive appetite, gas, hemorrhoids, indigestion, loss of appetite, nausea, vomiting, vomiting blood, and yellowish skin color. Heme:  Denies abnormal bruising, bleeding, enlarge lymph nodes, fevers, pallor, and skin discoloration.  Physical Exam  General:  alert, well-developed, well-nourished, well-hydrated, appropriate dress, normal appearance, healthy-appearing, cooperative to examination, good hygiene, and overweight-appearing.   Head:  normocephalic, atraumatic, no abnormalities observed, and no abnormalities palpated.   Eyes:  vision grossly intact and no injection.   Ears:  R ear normal and L ear normal.   Mouth:  Oral mucosa and oropharynx without lesions or exudates.  Teeth in good repair. Neck:  supple, full ROM, no masses, no thyromegaly, no thyroid nodules or tenderness, no JVD, normal carotid upstroke, no carotid bruits, no cervical lymphadenopathy, and no neck tenderness.   Breasts:  no gynecomastia, no masses, and no adenopathy.   Lungs:  Normal respiratory effort, chest expands symmetrically. Lungs are clear to auscultation, no crackles or wheezes. Heart:  Normal rate and regular rhythm. S1 and S2 normal without gallop, murmur, click, rub or other extra sounds. Abdomen:  soft, non-tender, normal bowel sounds, no distention, no masses, no guarding, no rigidity, no rebound tenderness, no abdominal hernia, no inguinal hernia, no  hepatomegaly, and no splenomegaly.   Rectal:  no external abnormalities, no hemorrhoids, normal sphincter tone, no masses, no tenderness, no fissures, no fistulae, no perianal rash, and stool positive for occult blood.   Genitalia:  circumcised, no hydrocele, no varicocele, no scrotal masses, no cutaneous lesions, no urethral discharge, R testes atrophic, and L testes atrophic.  penile prosthesis noted. Prostate:  no nodules, no asymmetry, no induration, and 1+ enlarged.   Msk:  normal ROM, no joint tenderness, no joint swelling, no joint warmth, no redness over joints, no joint deformities, no joint instability, and no crepitation.   Pulses:  R and L carotid,radial,femoral,dorsalis pedis and posterior tibial pulses are full and equal bilaterally Extremities:  No clubbing, cyanosis, edema, or deformity noted with normal full range of motion of all joints.   Neurologic:  No cranial nerve deficits noted. Station and gait are normal. Plantar reflexes are down-going bilaterally. DTRs are  symmetrical throughout. Sensory, motor and coordinative functions appear intact. Skin:  turgor normal, color normal, no rashes, no suspicious lesions, no ecchymoses, no petechiae, no purpura, no ulcerations, and no edema.   Cervical Nodes:  no anterior cervical adenopathy and no posterior cervical adenopathy.   Axillary Nodes:  no R axillary adenopathy and no L axillary adenopathy.   Inguinal Nodes:  no R inguinal adenopathy and no L inguinal adenopathy.   Psych:  Cognition and judgment appear intact. Alert and cooperative with normal attention span and concentration. No apparent delusions, illusions, hallucinations   Impression & Recommendations:  Problem # 1:  BLOOD IN STOOL (ICD-578.1) Assessment New  Orders: Gastroenterology Referral (GI)  Problem # 2:  ANEMIA-IRON DEFICIENCY (ICD-280.9) Assessment: Unchanged  His updated medication list for this problem includes:    Ferretts 325 (106 Fe) Mg Tabs (Ferrous  fumarate) .Marland Kitchen... Take 1 tablet by mouth two times a day.  Orders: Venipuncture (28315) TLB-CBC Platelet - w/Differential (85025-CBCD) TLB-Cortisol (82533-CORT) TLB-PSA (Prostate Specific Antigen) (84153-PSA)  Problem # 3:  HYPOTENSION (ICD-458.9) Assessment: Improved  Orders: Venipuncture (17616) TLB-CBC Platelet - w/Differential (85025-CBCD) TLB-Cortisol (82533-CORT) TLB-PSA (Prostate Specific Antigen) (84153-PSA)  Complete Medication List: 1)  Ferretts 325 (106 Fe) Mg Tabs (Ferrous fumarate) .... Take 1 tablet by mouth two times a day. 2)  Aspirin 81 Mg Tabs (Aspirin) .... Take 1 tablet by mouth once a day. 3)  Fish Oil 1000 Mg Caps (Omega-3 fatty acids) .... Take 1 capsule by mouth once a day. 4)  Daily Multiple Vitamins Tabs (Multiple vitamin) .... Take 1 tablet by mouth once a day.  Patient Instructions: 1)  Please schedule a follow-up appointment in 2 months. 2)  It is important that you exercise regularly at least 20 minutes 5 times a week. If you develop chest pain, have severe difficulty breathing, or feel very tired , stop exercising immediately and seek medical attention. 3)  You need to lose weight. Consider a lower calorie diet and regular exercise.  4)  Schedule a colonoscopy/sigmoidoscopy to help detect colon cancer.  Current Allergies (reviewed today): No known allergies      Preventive Care Screening     doesn't remember last tetanus shot. Never had colonoscopy. Nicki Guadalajara Fergerson CMA Duncan Dull)  July 06, 2010 10:43 AM

## 2010-11-28 NOTE — Progress Notes (Signed)
----   Converted from flag ---- ---- 09/25/2010 11:32 AM, Louis Meckel MD wrote: No.  I just saw the path this morning.  I will leave it to you to f/u, if that is ok.  ---- 09/25/2010 10:57 AM, Etta Grandchild MD wrote: thanks for the update, has anyone told him tp f/up with me for an HIV test?  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  I will send a Rx for flucanazole..  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    Rob ------------------------------  Phone Note Outgoing Call   Summary of Call: LA, can we get him in for some f/up labs? Initial call taken by: Etta Grandchild MD,  September 25, 2010 11:40 AM  Follow-up for Phone Call        Patient notified and appt set for 09/26/10 at 8:15 Follow-up by: Rock Nephew CMA,  September 25, 2010 1:07 PM

## 2010-11-28 NOTE — Letter (Signed)
Summary: Results Follow-up Letter  [facility]  [address] [place], [place] [address]   Phone: [phone]  Fax: [phone]    09/27/2010  [address] [place], [place]  [address]  Dear Mr. GUSTAFSON,   The following are the results of your recent test(s):  Test     Result     CBC       mild anemia HIV       negative Liver       normal   _________________________________________________________  Please call for an appointment in a few weeks _________________________________________________________ _________________________________________________________ _________________________________________________________  Sincerely,  Sanda Linger MD [facility]

## 2010-11-28 NOTE — Procedures (Signed)
Summary: Endo Prep  Endo Prep   Imported By: Lester Dorchester 09/15/2010 10:54:59  _____________________________________________________________________  External Attachment:    Type:   Image     Comment:   External Document

## 2010-11-28 NOTE — Procedures (Signed)
Summary: Small Bowel Endoscopy  Patient: [name] Note: All result statuses are Final unless otherwise noted.  Tests: (1) Small Bowel Endoscopy (SBE)  SBE Small Bowel Endoscopy                             DONE     [facility]     [address] [place], [place]  [address]           OPERATIVE PROCEDURE REPORT           PATIENT:  Roy, Lyons  MR#:  [id number]     BIRTHDATE:  09-30-1961, 49 yrs. old  GENDER:  male           ENDOSCOPIST:  Barbette Hair. Arlyce Dice, MD     ASSISTANT:           PROCEDURE DATE:  09/19/2010     PROCEDURE:  Small Bowel Endoscopy and Biopsy, EGD     ASA CLASS:  Class II     INDICATIONS:  1) iron deficiency anemia  2) hemoccult positive     stools           MEDICATIONS:   Fentanyl 100 mcg IV, Versed 10 mg, glycopyrrolate     (Robinal) 0.2 mg     TOPICAL ANESTHETIC:  Exactacain Spray           DESCRIPTION OF PROCEDURE:   After the risks benefits and     alternatives of the procedure were thoroughly explained, informed     consent was obtained.  The  endoscope was introduced through the     mouth and advanced to the proximal jejunum, without limitations.     The instrument was slowly withdrawn as the mucosa was fully     examined.     <<PROCEDUREIMAGES>>           Esophagitis was found in the distal esophagus. Erosive esophagitis     with friable mucosa. There was moderate bleeding from the passage     of the pediatric endoscope. Bxs taken (see image1 and image7).  A     stricture was found. Moderate pyloric stenosis. Scope passed with     resistance (see image6).  normal jejunum (see image2 and image3).     The duodenal bulb was normal in appearance, as was the postbulbar     duodenum (see image5).  otherwise normal exam.    Retroflexed     views revealed no abnormalities.    The scope was then withdrawn     from the patient and the procedure terminated.           COMPLICATIONS:  None           ENDOSCOPIC IMPRESSION:     1) Esophagitis  in the distal esophagus - probable cause of heme     positive stool/anemia     2) Pyloric stenosis     3) Normal jejunum     4) Normal duodenum     5) Otherwise normal exam     RECOMMENDATIONS:     1) await biopsy results     2) Nexium 40 mg     3) OV 4 weeks; f/u hemeoccults/CBC 4 weeks           REPEAT EXAM:  No           ______________________________     Barbette Hair. Arlyce Dice, MD  CC:  Etta Grandchild, M.D.           n.     eSIGNED:   Barbette Hair. [name] at 09/19/2010 01:13 PM           [place], [id number]  Note: An exclamation mark (!) indicates a result that was not dispersed into the flowsheet. Document Creation Date: 09/19/2010 1:13 PM _______________________________________________________________________  (1) Order result status: Final Collection or observation date-time: 09/19/2010 13:06 Requested date-time:  Receipt date-time:  Reported date-time:  Referring Physician:   Ordering Physician: Melvia Heaps [phone]) Specimen Source:  Source: Launa Grill Order Number: [id number] Lab site:

## 2010-11-30 NOTE — Assessment & Plan Note (Signed)
Summary: FOLLOW UP FROM CBC AND PROCEDURE/RS   History of Present Illness Visit Type: Initial Visit Primary GI MD: Melvia Heaps MD [facility] Primary Provider: Etta Grandchild MD Chief Complaint: Follow up from procedures in November and CBC in December. Pt stated he just was here today to follow up. History of Present Illness:   [name] has returned for followup of his heme positive stools and iron deficiency anemia.  Colonoscopy was unrevealing.  Upper endoscopy demonstrated Candida esophagitis.  It was felt that his esophagitis could have been the source for his Hemoccult-positive stool.  He was treated with fluconazole.  Followup hemoccults in December were negative x6.  Hemoglobin in December, 2011 was 12.3.  It was 12.5 in September.  He remains on iron.   GI Review of Systems      Denies abdominal pain, acid reflux, belching, bloating, chest pain, dysphagia with liquids, dysphagia with solids, heartburn, loss of appetite, nausea, vomiting, vomiting blood, weight loss, and  weight gain.        Denies anal fissure, black tarry stools, change in bowel habit, constipation, diarrhea, diverticulosis, fecal incontinence, heme positive stool, hemorrhoids, irritable bowel syndrome, jaundice, light color stool, liver problems, rectal bleeding, and  rectal pain.    Current Medications (verified): 1)  Ferretts 325 (106 Fe) Mg Tabs (Ferrous Fumarate) .... Take 1 Tablet By Mouth Two Times A Day. 2)  Aspirin 81 Mg Tabs (Aspirin) .... Take 1 Tablet By Mouth Once A Day. 3)  Fish Oil 1000 Mg Caps (Omega-3 Fatty Acids) .... Take 1 Capsule By Mouth Once A Day. 4)  Daily Multiple Vitamins  Tabs (Multiple Vitamin) .... Take 1 Tablet By Mouth Once A Day. 5)  Nexium 40 Mg Cpdr (Esomeprazole Magnesium) .... Take 1 Tab 1/2 Before Breakfrast  Allergies (verified): No Known Drug Allergies  Past History:  Past Medical History: Last updated: 07/06/2010 hypotension Anemia-iron deficiency  Past Surgical  History: Last updated: 07/06/2010 Tonsillectomy  Social History: Last updated: 07/06/2010 Occupation: pastor Single Current Smoker Alcohol use-yes Regular exercise-yes  Family History: Mother--living, diabetes, stroke, heart disease (bypass) Father--living, diabetes, ?cancer 1 sister--diabetes 1 brother-- alive and well Grandfather had colon cancer  Review of Systems  The patient denies allergy/sinus, anemia, anxiety-new, arthritis/joint pain, back pain, blood in urine, breast changes/lumps, change in vision, confusion, cough, coughing up blood, depression-new, fainting, fatigue, fever, headaches-new, hearing problems, heart murmur, heart rhythm changes, itching, muscle pains/cramps, night sweats, nosebleeds, shortness of breath, skin rash, sleeping problems, sore throat, swelling of feet/legs, swollen lymph glands, thirst - excessive, urination - excessive, urination changes/pain, urine leakage, vision changes, and voice change.    Vital Signs:  Patient profile:   50 year old male Height:      72.5 inches Weight:      220 pounds BMI:     29.53 BSA:     2.23 Pulse rate:   80 / minute Pulse rhythm:   regular BP sitting:   110 / 80  (left arm)  Vitals Entered By: Merri Ray CMA Duncan Dull) (November 09, 2010 10:36 AM)   Impression & Recommendations:  Problem # 1:  ANEMIA-IRON DEFICIENCY (ICD-280.9) This most likely was due to chronic GI blood loss from  esophagitis.  He most recently was Hemoccult-negative.  Recommendations #1 followup Hemoccults in 3 months #2 D/C iron; repeat CBC in 3 months  Problem # 2:  CANDIDIASIS, ESOPHAGEAL (ICD-112.84) He has no prior antibiotic use.  He tested  HIV negative.  Etiology for Candida esophagitis is  uncertain.  Patient Instructions: 1)  Copy sent to : Etta Grandchild MD 2)  You will need to follow up labs and hemoccults in 3 months 3)  The medication list was reviewed and reconciled.  All changed / newly prescribed medications  were explained.  A complete medication list was provided to the patient / caregiver.

## 2010-11-30 NOTE — Procedures (Signed)
Summary: Endoscopy / [facility]  Endoscopy / [facility]   Imported By: Lennie Odor 10/09/2010 10:29:59  _____________________________________________________________________  External Attachment:    Type:   Image     Comment:   External Document

## 2010-11-30 NOTE — Progress Notes (Signed)
Summary: Medication  Phone Note Call from Patient Call back at Home Phone [phone]   Caller: Patient Call For: Dr. Arlyce Dice Reason for Call: Talk to Nurse Summary of Call: Pt wants to know if we have any discount cards for nexium Initial call taken by: [place] Johnson,  October 18, 2010 10:31 AM  Follow-up for Phone Call        Left message to call back  Pt came into our office and asked for some samples.  He saw Dr. Arlyce Dice , had EGD in Nov  2011 and Colon in Oct 2011.  I gave him some samples of Nexium.   Lot# [id number], exp date: 03/2013 Follow-up by: Joselyn Glassman,  October 18, 2010 11:33 AM

## 2010-11-30 NOTE — Progress Notes (Signed)
Summary: Triage  Phone Note Call from Patient Call back at Home Phone [phone]   Caller: Patient Call For: Dr. Arlyce Dice Reason for Call: Talk to Nurse Summary of Call: has some questions about his diet with his acid reflux,  Wonders if there are any food he should stay away from like coffee. Initial call taken by: Karna Christmas,  October 09, 2010 8:47 AM  Follow-up for Phone Call        Patient is advised to begin an antireflux diet.  Instructed to avoid caffiene, chocolate, mint, citrus, spicy greasy foods, NSAIDS, and red wine.  Patient is instructed to avoid exercise or going to bed within 2 hours of a meal.  Asked to eat multiple small meals rather than 3 large meals.  Patient is also instructed to take PPI 30 minutes prior to the first meal of the day. Diet mailed to patient. Selinda Michaels RN  October 09, 2010 2:38 PM

## 2011-01-08 LAB — CBC
MCH: 29 pg (ref 26.0–34.0)
MCHC: 33.3 g/dL (ref 30.0–36.0)
Platelets: 238 10*3/uL (ref 150–400)
RBC: 4.1 MIL/uL — ABNORMAL LOW (ref 4.22–5.81)
WBC: 11 10*3/uL — ABNORMAL HIGH (ref 4.0–10.5)

## 2011-01-08 LAB — COMPREHENSIVE METABOLIC PANEL
AST: 29 U/L (ref 0–37)
Alkaline Phosphatase: 50 U/L (ref 39–117)
CO2: 28 mEq/L (ref 19–32)
Chloride: 105 mEq/L (ref 96–112)
GFR calc non Af Amer: >60 mL/min (ref >60)

## 2011-01-08 LAB — DIFFERENTIAL
Eosinophils Relative: 1 % (ref 0–5)
Lymphocytes Relative: 18 % (ref 12–46)
Lymphs Abs: 1.9 10*3/uL (ref 0.7–4.0)
Monocytes Absolute: 0.7 10*3/uL (ref 0.1–1.0)
Neutrophils Relative %: 75 % (ref 43–77)

## 2011-01-11 LAB — CBC
HCT: 35.8 % — ABNORMAL LOW (ref 39.0–52.0)
Hemoglobin: 11.7 g/dL — ABNORMAL LOW (ref 13.0–17.0)
MCH: 28.3 pg (ref 26.0–34.0)
MCH: 28.8 pg (ref 26.0–34.0)
MCHC: 33.2 g/dL (ref 30.0–36.0)
MCV: 86.5 fL (ref 78.0–100.0)
Platelets: 232 10*3/uL (ref 150–400)
Platelets: 245 10*3/uL (ref 150–400)
RDW: 15.6 % — ABNORMAL HIGH (ref 11.5–15.5)
WBC: 7.3 10*3/uL (ref 4.0–10.5)

## 2011-01-11 LAB — DIFFERENTIAL
Basophils Absolute: 0.1 10*3/uL (ref 0.0–0.1)
Basophils Relative: 1 % (ref 0–1)
Eosinophils Absolute: 0.2 10*3/uL (ref 0.0–0.7)
Eosinophils Relative: 3 % (ref 0–5)
Lymphs Abs: 2.2 10*3/uL (ref 0.7–4.0)
Monocytes Absolute: 0.8 10*3/uL (ref 0.1–1.0)

## 2011-01-11 LAB — COMPREHENSIVE METABOLIC PANEL
Albumin: 3.2 g/dL — ABNORMAL LOW (ref 3.5–5.2)
Alkaline Phosphatase: 45 U/L (ref 39–117)
Calcium: 8.6 mg/dL (ref 8.4–10.5)
Chloride: 107 mEq/L (ref 96–112)
GFR calc Af Amer: >60 mL/min (ref >60)
Sodium: 135 mEq/L (ref 135–145)

## 2011-01-11 LAB — T4, FREE: Free T4: 0.85 ng/dL (ref 0.80–1.80)

## 2011-01-11 LAB — POCT CARDIAC MARKERS
CKMB, poc: 1.2 ng/mL (ref 1.0–8.0)
Myoglobin, poc: 74.2 ng/mL (ref 12–200)

## 2011-01-11 LAB — PROTIME-INR
INR: 1.03 (ref 0.00–1.49)
Prothrombin Time: 13.7 seconds (ref 11.6–15.2)

## 2011-01-11 LAB — MAGNESIUM: Magnesium: 2.2 mg/dL (ref 1.5–2.5)

## 2011-01-12 LAB — HEPATIC FUNCTION PANEL
ALT: 21 U/L (ref 0–53)
AST: 27 U/L (ref 0–37)
Albumin: 3.7 g/dL (ref 3.5–5.2)
Bilirubin, Direct: <0.1 mg/dL (ref 0.0–0.3)
Total Bilirubin: 0.3 mg/dL (ref 0.3–1.2)

## 2011-01-12 LAB — BASIC METABOLIC PANEL
BUN: 14 mg/dL (ref 6–23)
CO2: 25 mEq/L (ref 19–32)
Calcium: 8.8 mg/dL (ref 8.4–10.5)
Creatinine, Ser: 0.99 mg/dL (ref 0.4–1.5)
GFR calc Af Amer: >60 mL/min (ref >60)

## 2011-01-12 LAB — IRON AND TIBC
Iron: 36 ug/dL — ABNORMAL LOW (ref 42–135)
TIBC: 322 ug/dL (ref 215–435)

## 2011-01-12 LAB — URINALYSIS, ROUTINE W REFLEX MICROSCOPIC
Bilirubin Urine: NEGATIVE
Glucose, UA: NEGATIVE mg/dL
Hgb urine dipstick: NEGATIVE
Ketones, ur: NEGATIVE mg/dL
pH: 6 (ref 5.0–8.0)

## 2011-01-12 LAB — VITAMIN B12: Vitamin B-12: 787 pg/mL (ref 211–911)

## 2011-01-12 LAB — CBC
MCH: 29.6 pg (ref 26.0–34.0)
Platelets: 214 10*3/uL (ref 150–400)
RBC: 3.91 MIL/uL — ABNORMAL LOW (ref 4.22–5.81)
RDW: 15.2 % (ref 11.5–15.5)
WBC: 7.3 10*3/uL (ref 4.0–10.5)

## 2011-01-12 LAB — HEMOGLOBIN AND HEMATOCRIT, BLOOD
HCT: 31.5 % — ABNORMAL LOW (ref 39.0–52.0)
HCT: 33.1 % — ABNORMAL LOW (ref 39.0–52.0)
HCT: 34.3 % — ABNORMAL LOW (ref 39.0–52.0)
HCT: 35.3 % — ABNORMAL LOW (ref 39.0–52.0)
Hemoglobin: 10.5 g/dL — ABNORMAL LOW (ref 13.0–17.0)
Hemoglobin: 10.8 g/dL — ABNORMAL LOW (ref 13.0–17.0)
Hemoglobin: 11.1 g/dL — ABNORMAL LOW (ref 13.0–17.0)
Hemoglobin: 11.9 g/dL — ABNORMAL LOW (ref 13.0–17.0)

## 2011-01-12 LAB — TSH: TSH: 2.542 u[IU]/mL (ref 0.350–4.500)

## 2011-01-12 LAB — DIFFERENTIAL
Basophils Absolute: 0.1 10*3/uL (ref 0.0–0.1)
Eosinophils Absolute: 0.1 10*3/uL (ref 0.0–0.7)
Lymphs Abs: 2.6 10*3/uL (ref 0.7–4.0)
Neutrophils Relative %: 51 % (ref 43–77)

## 2011-01-12 LAB — RETICULOCYTES
RBC.: 4.09 MIL/uL — ABNORMAL LOW (ref 4.22–5.81)
Retic Count, Absolute: 40.9 10*3/uL (ref 19.0–186.0)

## 2011-01-12 LAB — POCT CARDIAC MARKERS: Myoglobin, poc: 83.5 ng/mL (ref 12–200)

## 2011-01-12 LAB — LACTIC ACID, PLASMA: Lactic Acid, Venous: 1.3 mmol/L (ref 0.5–2.2)

## 2011-01-20 ENCOUNTER — Emergency Department (HOSPITAL_COMMUNITY)
Admission: EM | Admit: 2011-01-20 | Discharge: 2011-01-20 | Disposition: A | Discharge status: Home / Self Care | Payer: Self-pay | Attending: Emergency Medicine | Admitting: Emergency Medicine

## 2011-01-20 DIAGNOSIS — M79609 Pain in unspecified limb: Secondary | ICD-10-CM | POA: Insufficient documentation

## 2011-01-20 DIAGNOSIS — I4891 Unspecified atrial fibrillation: Secondary | ICD-10-CM | POA: Insufficient documentation

## 2011-01-20 DIAGNOSIS — E785 Hyperlipidemia, unspecified: Secondary | ICD-10-CM | POA: Insufficient documentation

## 2011-02-12 ENCOUNTER — Telehealth: Payer: Self-pay | Admitting: Gastroenterology

## 2011-02-12 DIAGNOSIS — D649 Anemia, unspecified: Secondary | ICD-10-CM

## 2011-02-13 NOTE — Telephone Encounter (Signed)
EXPLAINED TO PT THAT HE COULD COME IN AND GET SAMPLES OF NEXIUM, ALSO TOLD HIM HE NEEDED FOLLOW UP LABS AND HEMOCCULTS DONE. WILL PUT ORDERS IN FOR THE LAB.

## 2011-02-19 ENCOUNTER — Other Ambulatory Visit (INDEPENDENT_AMBULATORY_CARE_PROVIDER_SITE_OTHER): Payer: Self-pay

## 2011-02-19 DIAGNOSIS — D649 Anemia, unspecified: Secondary | ICD-10-CM

## 2011-02-19 LAB — CBC WITH DIFFERENTIAL/PLATELET
Basophils Absolute: 0.1 10*3/uL (ref 0.0–0.1)
Eosinophils Absolute: 0.1 10*3/uL (ref 0.0–0.7)
HCT: 35.8 % — ABNORMAL LOW (ref 39.0–52.0)
Hemoglobin: 12.1 g/dL — ABNORMAL LOW (ref 13.0–17.0)
Lymphs Abs: 2.3 10*3/uL (ref 0.7–4.0)
MCHC: 33.9 g/dL (ref 30.0–36.0)
Monocytes Absolute: 0.9 10*3/uL (ref 0.1–1.0)
Monocytes Relative: 11.3 % (ref 3.0–12.0)
Neutro Abs: 5 10*3/uL (ref 1.4–7.7)
Platelets: 210 10*3/uL (ref 150.0–400.0)
RDW: 15.5 % — ABNORMAL HIGH (ref 11.5–14.6)

## 2011-02-26 ENCOUNTER — Other Ambulatory Visit: Payer: Self-pay | Admitting: Gastroenterology

## 2011-02-26 ENCOUNTER — Other Ambulatory Visit: Payer: Self-pay

## 2011-02-26 DIAGNOSIS — Z0000 Encounter for general adult medical examination without abnormal findings: Secondary | ICD-10-CM

## 2011-02-26 LAB — HEMOCCULT SLIDES (X 3 CARDS)
OCCULT 1: NEGATIVE
OCCULT 4: NEGATIVE

## 2011-03-13 NOTE — Cardiovascular Report (Signed)
NAMEFLEET, HIGHAM                 ACCOUNT NO.:  [id number]   MEDICAL RECORD NO.:  [id number]          PATIENT TYPE:  INP   LOCATION:  4737                         FACILITY:  [facility]   PHYSICIAN:  Georga Hacking, M.D.DATE OF BIRTH:  May 06, 1961   DATE OF PROCEDURE:  03/23/2008  DATE OF DISCHARGE:                            CARDIAC CATHETERIZATION   HISTORY:  A 50 year old male who presented with prolonged chest  discomfort.  He has a history of atrial fibrillation and PACs.  A stress  Cardiolite test was remarkable for a mid to distal anterior ischemia.  He also had an inadequate chronotropic response to exercise and a  blunted blood pressure response to exercise.   PROCEDURE:  Left heart catheterization with coronary angiograms and left  ventriculogram.   COMMENTS ABOUT PROCEDURE:  The patient tolerated the procedure well  without complications.  The right femoral artery was entered using a  single anterior needle wall stick.  There was some catheter damping and  what was felt to be catheter induced spasm of the right coronary artery  that resolved after nitroglycerin.  He became mildly hypotensive  following this and had to have intravenous fluids.  The right coronary  catheter was selected using a no torque right catheter.  He was taken to  the holding area for sheath removal following the procedure.   HEMODYNAMIC DATA:  Aorta postcontrast 130/87, LV postcontrast 130/124.   ANGIOGRAPHIC DATA:  Left ventriculogram:  Performed in the 30 degree RAO  projection.  The aortic valve is normal.  The mitral valve is normal.  The left ventricle appears normal in size.  Estimated ejection fraction  is 55%.  Coronary arteries arise and distribute normally.  There is no  significant coronary artery calcification noted.  The left main coronary  artery is normal.  Left anterior descending is a large vessel and  contains no significant obstructive disease.  The circumflex coronary  artery has  a large intermediate branch that is free of disease.  The  circumflex has two coronary arteries.  He has two marginal arteries.  The right coronary artery is a dominant vessel that contains no  significant disease.   IMPRESSION:  1. No significant obstructive coronary artery disease.  2. Normal left ventricular function.   PLAN:  Continue medical treatment evaluation for other sources of pain.      Georga Hacking, M.D.  Electronically Signed     WST/MEDQ  D:  03/23/2008  T:  03/23/2008  Job:  [id number]

## 2011-03-13 NOTE — Discharge Summary (Signed)
[name], [name]                 ACCOUNT NO.:  [id number]   MEDICAL RECORD NO.:  [id number]          PATIENT TYPE:  INP   LOCATION:  4737                         FACILITY:  [facility]   PHYSICIAN:  Lucita Ferrara, MD         DATE OF BIRTH:  [birth year]   DATE OF ADMISSION:  03/17/2008  DATE OF DISCHARGE:  03/23/2008                               DISCHARGE SUMMARY   DISCHARGE DIAGNOSES:  1. Chest pain.  2. Gastroesophageal reflux disease.  3. Status post cardiac cath with results showing normal coronary      vessels and no significant obstructive coronary artery disease.      The chest pain deemed to be noncardiac per cardiology.  4. Remote History of atrial fibrillation.  5. History of erectile dysfunction.   CONSULTANTS:  Dr. Donnie Aho from cardiology services.   PROCEDURES:  The patient had a cardiac catheterization on Mar 23, 2008  which showed no significant obstructive coronary artery disease.  Please  refer to dictation number [id number] for full details.  The patient had a 2-  D echocardiogram on Mar 19, 2008 which showed an ejection fraction of  65% and LVH.  Myoview showed anterior wall ischemia.  Stress test on Mar 19, 2008 was positive.   BRIEF HISTORY OF PRESENT ILLNESS:  [name] is a 50 year old male who  presented to [facility] on Mar 17, 2008 with chest pain  that was very atypical in nature, located in the epigastric area, that  was relieved by antacids.  The patient was admitted to the medical  telemetry unit.  Cardiac enzymes were cycled x3, and they were  essentially negative.  A 2-D echocardiogram was ordered on Mar 19, 2008  that showed overall left ventricular systolic function normal, left  ventricle ejection fraction of 65%.  There was left ventricular wall  thickness moderately.  The patient then underwent a Myoview on Mar 19, 2008 which shows findings suggestive of anterior wall ischemia with an  ejection fraction of 55%.  Thereafter, the patient  underwent a heart  catheterization on Mar 23, 2008 which was essentially negative.  Risk  factors were also assessed including lipid profile.  The patient's LDL  was found to be 114.  TSH was within normal limits at 3.115, and his D-  dimer was 0.39, which is essentially negative.  It was the  cardiologist's recommendation that the patient could go home and  essentially follow up with his primary care doctor for risk factor  modifications to be continued with diet and exercise.   DISCHARGE CONDITION:  Stable.   DISCHARGE DISPOSITION:  Home.   DISCHARGE DIET:  Cardiac diet.   PLANS FOR OUTPATIENT:  The patient is to set up with the primary care  physician's office a visit.  We will actually try to set this up for  him.      Lucita Ferrara, MD  Electronically Signed     RR/MEDQ  D:  03/23/2008  T:  03/23/2008  Job:  [id number]

## 2011-03-13 NOTE — H&P (Signed)
[name], [name]                 ACCOUNT NO.:  [id number]   MEDICAL RECORD NO.:  [id number]          PATIENT TYPE:  INP   LOCATION:  1843                         FACILITY:  [facility]   PHYSICIAN:  Lucita Ferrara, MD         DATE OF BIRTH:  11/13/1960   DATE OF ADMISSION:  03/17/2008  DATE OF DISCHARGE:                              HISTORY & PHYSICAL   PRIMARY CARE PHYSICIAN:  Unassigned.   PRIMARY CARDIOLOGIST:  Dr. Viann Fish.   HISTORY OF PRESENT ILLNESS:  The patient is a 50 year old African  American male presents to [facility] with chief complaint  of chest pain that occurred at 10 a.m. in the morning while the patient  was watching the basketball [employer].  Reported as feeling as if he  was getting heartburn while drinking some 7-Up.  Chest pain resolved  upon having some Mylanta.  After he had the Mylanta, however, the  patient started feeling dizzy and could not ambulate.  He also felt some  palpitations.  Chest pain was located epigastric and it was a 4 out of  10 in intensity.  He denies any shortness of breath, orthopnea,  paroxysmal nocturnal dyspnea, lower extremity edema.  Otherwise review  of systems negative.  Denies any fevers, chills, cough.   PAST MEDICAL HISTORY:  1. Atrial fibrillation, paroxysmal in nature.   1. Status post hernia repair.  2. Status post tonsillectomy.   There is no documented history of hypertension, hyperlipidemia or  coronary artery disease.  He has only been treated for paroxysmal atrial  fibrillation by Dr. Donnie Aho in the emergency room.   ALLERGIES:  No known drug allergy.   MEDICATIONS:  Aspirin 81 mg p.o. daily.   SOCIAL HISTORY:  Denies drugs, alcohol or tobacco.   REVIEW OF SYSTEMS:  Systems per HPI, otherwise negative.   PHYSICAL EXAMINATION:  GENERAL:  The patient is very comfortable.  Denies any chest pain or shortness of breath.  VITAL SIGNS:  Blood pressure is 108/66, pulse 90, respiration 18,  temperature 98.1.  HEENT:  Normocephalic, atraumatic.  Sclerae anicteric.  NECK:  Supple.  No JVD or carotid bruits.  PERRLA.  External muscles  intact.  CARDIOVASCULAR:  S1 and S2.  Regular rhythm.  No murmurs, rubs or  clicks.  ABDOMEN:  Abdomen is soft, nontender, nondistended.  Positive bowel  sounds.  LUNGS:  Clear to auscultation bilaterally.  No rhonchi, rales or  wheezes.  EXTREMITIES:  No clubbing, cyanosis or edema.  NEURO:  Patient is alert and oriented x3.  Cranial nerves II-XII grossly  intact.   LABORATORY DATA:  EKG shows rate 85 normal sinus rhythm.  There are some  PVCs; otherwise normal.   ASSESSMENT/PLAN:  A 50 year old [place] American male presents with  atypical chest pain that is located epigastric and relieved by antacids.  The patient  was still feeling dizzy upon ambulation in the emergency  room and thus could not be discharged home.  His workup so far is pretty  benign.   PLAN:  Will admit the patient to  the medical telemetry unit and will  monitor on telemetry.  Will cycle cardiac enzymes x3 every 8 hours.  Check TSH, fasting lipid profile in the morning.  Essentially I believe  the patient could be discharged home if workup is negative.  However,  the patient may need a cardiology evaluation for either inpatient or  outpatient nuclear stress test at some point.  If his epigastric pain  does not resolve, the patient may benefit from long-term proton pump  inhibitors and for gastroenterology consultation for possible endoscopy.  Essentially his CBC is normal.  His laboratory work so far negative.  Will go ahead and continue to monitor.      Lucita Ferrara, MD  Electronically Signed     RR/MEDQ  D:  03/18/2008  T:  03/18/2008  Job:  [id number]

## 2011-03-16 ENCOUNTER — Telehealth: Payer: Self-pay | Admitting: Gastroenterology

## 2011-03-16 NOTE — H&P (Signed)
NAMEROCKWELL, ZENTZ NO.:  [id number]   MEDICAL RECORD NO.:  [id number]          PATIENT TYPE:  EMS   LOCATION:  MAJO                         FACILITY:  [facility]   PHYSICIAN:  Georga Hacking, M.D.DATE OF BIRTH:  23-Oct-1961   DATE OF ADMISSION:  04/04/2006  DATE OF DISCHARGE:                                HISTORY & PHYSICAL   REASON FOR ADMISSION:  Atrial fibrillation, chest pain and hypotension.   HISTORY:  The patient is a 50 year old black male who has had recurrent  paroxysmal atrial fibrillation every year or so going back for at least 10  years.  He has not been recently on any medicines and in his usual state of  health until last evening, when he developed chest pressure and developed  rapid atrial fibrillation.  He presented to the emergency room this morning  in rapid atrial fibrillation.  He was given a Cardizem bolus and felt better  but became somewhat hypotensive following that and was given IV fluids.  He  previously has been given flecainide in the past, which resulted in  conversion of his atrial fibrillation.  Echocardiogram has been unremarkable  in the past, and he is not felt to have structural heart disease.   PAST MEDICAL HISTORY:  Benign.  He has no history of hypertension, diabetes,  hyperlipidemia or coronary artery disease.  He has known paroxysmal atrial  fibrillation.   PREVIOUS SURGERY:  Hernia repair, tonsillectomy.  He also has an inflatable  penile prosthesis and had recent revision of this by Dr. Earlene Plater.   ALLERGIES:  None.   CURRENT MEDICINES:  Aspirin.   SOCIAL HISTORY:  Occasionally smokes cigars.  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   FAMILY HISTORY:  Noncontributory.   REVIEW OF SYSTEMS:  Otherwise unremarkable.   PHYSICAL EXAMINATION:  GENERAL:  A pleasant black male in no acute distress.  VITAL SIGNS:  Blood pressure currently 115/70, pulse is 70 and irregular.  SKIN:  Warm and dry.  HEENT:  EOMI, PERRLA.   CNS clear.  Fundi unremarkable.  Pharynx negative.  NECK:  Supple, no masses, JVD, thyromegaly or bruits.  CARDIAC:  Irregular rhythm.  Normal S1, S2, no S3, S4 or murmur.  ABDOMEN:  Soft, nontender, without mass.  Distal pulses 2+.   A 12-lead EKG shows atrial fibrillation, no acute changes.  Lab  unremarkable.   IMPRESSION:  1.  Paroxysmal atrial fibrillation.  2.  Hypotension.  3.  Chest pain due to paroxysmal atrial fibrillation.  Previous Cardiolite      scan 2 years ago.   RECOMMENDATIONS:  Admit, load with flecainide, anticoagulate, rule out MI.      Georga Hacking, M.D.  Electronically Signed     WST/MEDQ  D:  04/04/2006  T:  04/04/2006  Job:  [id number]

## 2011-03-16 NOTE — Telephone Encounter (Signed)
Pt needs samples of Nexium pt picking up today

## 2011-03-16 NOTE — Op Note (Signed)
[name], [name]                 ACCOUNT NO.:  [id number]   MEDICAL RECORD NO.:  [id number]          PATIENT TYPE:  AMB   LOCATION:  DAY                          FACILITY:  [facility]   PHYSICIAN:  Ronald L. Earlene Plater, M.D.  DATE OF BIRTH:  1961-07-05   DATE OF PROCEDURE:  12/19/2005  DATE OF DISCHARGE:                                 OPERATIVE REPORT   DIAGNOSIS:  Failed two piece inflatable penile prosthesis.   OPERATIVE PROCEDURE:  Removal of failed two piece inflatable penile  prosthesis with replacement of three piece AMS IPP.   SURGEON:  Lucrezia Starch. Earlene Plater, M.D.   ASSISTANT:  Alessandra Bevels. Chase Picket, FNP-C.   ANESTHESIA:  General endotracheal.   ESTIMATED BLOOD LOSS:  75 cc.   TUBES:  A 16 French Foley.   COMPLICATIONS:  None.   INDICATIONS FOR PROCEDURE:  [name] is a very nice 50 year old black male who  had a two piece [place] inflatable penile prosthesis placed many years ago.  He is now active and would like to have erections, and the device has  failed.  On examination, it appears to be totally deflated.  After  understanding risks, benefits, and alternatives, has elected to proceed with  the above procedure.   PROCEDURE IN DETAIL:  The patient was placed in the supine position.  After  proper general endotracheal anesthesia, was placed in the supine position  and prepped and draped with Betadine in a sterile fashion.  A 2 cm  penoscrotal incision was made with sharp dissection and was carried down.  The capsule around the pump was incised sharply with Bovie coagulation  cautery, and the device was removed.  It was taken down to the corpora.  Corporotomies were performed, and the entire device with 2 RTEs bilaterally  was removed.  The corporotomies were then closed with running 3-0 PDS  suture.  Next, a small infrapubic incision was made vertically.  Sharp  dissection was carried down to the subcutaneous tissue.  The fascia was  identified, and a linear incision was made in  it.  The rectus muscle belly  was retracted laterally.  The space of Retzius was entered with a finger,  and the space was created to place the reservoir.  The corpora cavernosa was  then approached, and vertical corporotomies were placed after holding  stitches were placed with 3-0 PDS sutures.  The capsule was found and easily  dilated to 13 [place] bilaterally.  The length of the penis on-stretch was 21  cm.  A right scrotal pouch was then created bluntly.  It might be noted that  the capsule on the right was difficult to find; therefore, the right  corporotomy was opened to help facilitate it.  The right penoscrotal  corporotomy was opened, and a sound was placed to facilitate it.  It was  then closed, again, with a running 3-0 PDS suture.  The device was then  approached.  It was a 19 cm cylinders through a 3 cm RTE antibiotic-  impregnated device.  Utilizing the needle passer in the furlough, it was  passed  distally, and the needles were deployed.  I deployed the string, and  the prosthesis dropped in place bilaterally with 3 cm RTE.  The device was  pumped and noted to be in good position.  It was let down, and the  corporotomies were then closed with running 3-0 PDS suture.  Thorough  irrigation was performed.  Good hemostasis was noted to be present.  The  reservoir was placed in the space of Retzius through the prior dissected  space and filled with 60 cc.  There was absolutely no back pressure on it.  The pump was then placed in the right subdartal pouch, which has been  created previously, and the connections were made securely.  Thorough  irrigation was performed with antibiotic solution, and good hemostasis was  noted to be present.  The penoscrotal incision was closed with multiple  layers with 3-0 chromic cat gut.  The 3-0 chromic cat gut in the running  layer was used to separate the tubing.  The skin was closed with skin  staples in the infrapubic incision.  The device was  cycled and noted to be  in excellent position.  It was deflated.  The wound was dressed sterilely.  He was taken to the recovery room stable.  A 16 French Foley catheter was in  place in the bladder.  It had been drained.  Urine was clear.      Ronald L. Earlene Plater, M.D.  Electronically Signed     RLD/MEDQ  D:  12/19/2005  T:  12/20/2005  Job:  [id number]

## 2011-03-16 NOTE — Discharge Summary (Signed)
NAME:  [name], [name]                           ACCOUNT NO.:  [id number]   MEDICAL RECORD NO.:  [id number]                   PATIENT TYPE:  OBV   LOCATION:  2012                                 FACILITY:  [facility]   PHYSICIAN:  Darden Palmer., M.D.         DATE OF BIRTH:  June 19, 1961   DATE OF ADMISSION:  06/23/2002  DATE OF DISCHARGE:  06/23/2002                                 DISCHARGE SUMMARY   FINAL DIAGNOSES:  1. Paroxysmal atrial fibrillation - resolved.  2. Chest pain due to #1.   HISTORY:  This 50 year old black male has a prior history of paroxysmal  atrial fibrillation.  He has had previously had chest pain and has been  placed on flecainide and diltiazem.  He was in his usual state of health  until he was awakened with substernal chest discomfort this morning that  persisted.  He was brought to the emergency room and was found to be in  atrial fibrillation.  He converted to sinus rhythm spontaneously and was  admitted to rule out an MI.  He took aspirin for the chest discomfort.   PAST HISTORY:  Remarkable for paroxysmal atrial fibrillation.   PREVIOUS SURGERY:  1. Hernia repair 3 years ago.  2. Tonsillectomy.   ALLERGIES:  None.   CURRENT MEDICATIONS:  Diltiazem Extended Release 240 daily, flecainide 100  b.i.d., aspirin daily.   SOCIAL HISTORY:  He occasionally smokes cigars.   REVIEW OF SYSTEMS:  Otherwise unremarkable.   PHYSICAL EXAMINATION:  GENERAL:  Pleasant black male in no acute distress.  VITAL SIGNS:  Blood pressure:  110/70.  Pulse:  90 and irregular when he  came in.  SKIN:  Warm and dry.  EENT:  Unremarkable.  No carotid bruits.  LUNGS:  Clear.  CARDIAC EXAM:  Normal S1, S2.  No S3.  ABDOMEN:  Soft, nontender, no mass.  Peripheral pulses 2+.   The patient brought in and converted spontaneously to sinus rhythm.  Because  of the chest pain, he was kept in observation.  MI was ruled out.  Initial  CPK was 298, with an albumin of 195 and an  MB of 5.3.  Subsequent CPK 8  hours later was 238 with an MB of 4.6.  Troponin was 0.02 on both occasions.  His chemistry panel was within normal limits.  CBC was normal.  PT and PTT  were normal, and liver enzymes were normal.  Repeat EKG at 6 p.m.  showed no acute changes.  It was elected to discharge the patient home.  A  flecainide level was drawn at a trough level, and he was instructed to  increase his flecainide to 150 b.i.d. and follow up in 1 week.  He was also  to use aspirin for anticoagulation.  Darden Palmer., M.D.    WST/MEDQ  D:  06/23/2002  T:  06/25/2002  Job:  [id number]

## 2011-04-16 ENCOUNTER — Telehealth: Payer: Self-pay | Admitting: Gastroenterology

## 2011-04-16 NOTE — Telephone Encounter (Signed)
Pt came in today and picked up Nexium samples

## 2011-05-17 ENCOUNTER — Other Ambulatory Visit: Payer: Self-pay | Admitting: Gastroenterology

## 2011-05-18 ENCOUNTER — Telehealth: Payer: Self-pay | Admitting: *Deleted

## 2011-05-18 NOTE — Telephone Encounter (Signed)
Mr Grove came in for some Nexium Samples today.  # 30 capsules Lot# [id number] Exp date: 01/2014

## 2011-05-22 NOTE — Telephone Encounter (Signed)
PT GIVEN SAMPLES WHILE I WAS OUT.Roy KitchenMarland Lyons

## 2011-06-21 ENCOUNTER — Telehealth: Payer: Self-pay | Admitting: Gastroenterology

## 2011-06-22 NOTE — Telephone Encounter (Signed)
Pt received samples of Nexium yesterday, he cam by the office before i could call him back

## 2011-07-25 LAB — CBC
HCT: 33.6 — ABNORMAL LOW
HCT: 34.2 — ABNORMAL LOW
HCT: 34.5 — ABNORMAL LOW
HCT: 41
Hemoglobin: 11.4 — ABNORMAL LOW
Hemoglobin: 11.7 — ABNORMAL LOW
Hemoglobin: 12 — ABNORMAL LOW
Hemoglobin: 12.1 — ABNORMAL LOW
Hemoglobin: 13.7
MCHC: 33.3
MCHC: 34.7
MCV: 87
MCV: 87.5
RBC: 3.84 — ABNORMAL LOW
RBC: 3.94 — ABNORMAL LOW
RBC: 4 — ABNORMAL LOW
RBC: 4.22
RBC: 4.71
RDW: 15.4
WBC: 6.9
WBC: 8.2
WBC: 8.7
WBC: 8.8

## 2011-07-25 LAB — CARDIAC PANEL(CRET KIN+CKTOT+MB+TROPI)
CK, MB: 2.3
CK, MB: 2.7
Relative Index: 2.5
Relative Index: INVALID
Total CK: 106
Troponin I: 0.04

## 2011-07-25 LAB — LIPID PANEL
LDL Cholesterol: 114 — ABNORMAL HIGH
Triglycerides: 67
VLDL: 13

## 2011-07-25 LAB — BASIC METABOLIC PANEL
CO2: 28
CO2: 30
Calcium: 8.9
Chloride: 108
Chloride: 110
GFR calc Af Amer: >60
GFR calc Af Amer: >60
Potassium: 3.8
Potassium: 4.3
Sodium: 141
Sodium: 142

## 2011-07-25 LAB — DIFFERENTIAL
Basophils Relative: 1
Eosinophils Absolute: 0.1
Eosinophils Relative: 1
Lymphs Abs: 2.1
Monocytes Absolute: 0.9
Monocytes Relative: 10

## 2011-07-25 LAB — PROTIME-INR: Prothrombin Time: 13.8

## 2011-07-25 LAB — POCT CARDIAC MARKERS
CKMB, poc: 1.8
Myoglobin, poc: 79.3
Myoglobin, poc: 82.1
Troponin i, poc: <0.05

## 2011-07-25 LAB — TSH: TSH: 3.115

## 2011-07-25 LAB — D-DIMER, QUANTITATIVE: D-Dimer, Quant: 0.39

## 2011-07-25 LAB — POCT I-STAT, CHEM 8
Chloride: 106
Creatinine, Ser: 1.1
Glucose, Bld: 95
Hemoglobin: 14.6
Potassium: 4.6
Sodium: 138

## 2011-07-31 ENCOUNTER — Other Ambulatory Visit: Payer: Self-pay | Admitting: Gastroenterology

## 2011-07-31 NOTE — Telephone Encounter (Signed)
Called pt to inform samples will be out front.

## 2011-09-04 ENCOUNTER — Telehealth: Payer: Self-pay | Admitting: Gastroenterology

## 2011-09-04 NOTE — Telephone Encounter (Signed)
We are out of Nexium samples, waiting on them to come in   L/M with pts mother

## 2011-09-10 ENCOUNTER — Telehealth: Payer: Self-pay | Admitting: Gastroenterology

## 2011-09-10 NOTE — Telephone Encounter (Signed)
Pt needs Nexium samples,,,we are out told I would call him when we get some in

## 2011-09-12 NOTE — Telephone Encounter (Signed)
Samples came in today  Called pt to pick up today

## 2011-10-11 ENCOUNTER — Telehealth: Payer: Self-pay | Admitting: Gastroenterology

## 2011-10-11 NOTE — Telephone Encounter (Signed)
Pt walked in and asked for Nexium samples, 4 boxes were given.  [employer] [facility] was notified.

## 2011-12-03 ENCOUNTER — Telehealth: Payer: Self-pay | Admitting: Gastroenterology

## 2011-12-03 NOTE — Telephone Encounter (Signed)
L/M for pt that I have 2 boxes of Nexium for him

## 2011-12-19 ENCOUNTER — Other Ambulatory Visit: Payer: Self-pay | Admitting: Gastroenterology

## 2011-12-20 NOTE — Telephone Encounter (Signed)
We have nexium samples for pt to pick up.. Called left message

## 2012-01-31 ENCOUNTER — Telehealth: Payer: Self-pay | Admitting: Gastroenterology

## 2012-01-31 NOTE — Telephone Encounter (Signed)
Called mobile and home number in the system and both #'s were incorrect. We do not have any samples at this time, in case the pt calls back.

## 2012-02-06 ENCOUNTER — Telehealth: Payer: Self-pay | Admitting: Gastroenterology

## 2012-02-06 NOTE — Telephone Encounter (Signed)
Called patient to inform we are out of Nexium samples

## 2012-02-12 ENCOUNTER — Telehealth: Payer: Self-pay | Admitting: Gastroenterology

## 2012-02-15 ENCOUNTER — Telehealth: Payer: Self-pay | Admitting: Gastroenterology

## 2012-02-15 NOTE — Telephone Encounter (Signed)
PT HAS NO INSURANCE GIVING HIM SAMPLES

## 2012-02-18 NOTE — Telephone Encounter (Signed)
Pt given samples  

## 2012-03-21 ENCOUNTER — Telehealth: Payer: Self-pay | Admitting: Gastroenterology

## 2012-03-21 MED ORDER — ESOMEPRAZOLE MAGNESIUM 40 MG PO CPDR: 40.0000 mg | DELAYED_RELEASE_CAPSULE | Freq: Every day | ORAL | Status: DC

## 2012-03-21 NOTE — Telephone Encounter (Signed)
Sent in script for [place] to [facility] for Nexium also will have samples out front

## 2012-03-28 ENCOUNTER — Telehealth: Payer: Self-pay | Admitting: Gastroenterology

## 2012-03-28 NOTE — Telephone Encounter (Signed)
Already spoke with the patient today Answered all questions told patient to call back as needed

## 2012-04-03 ENCOUNTER — Telehealth: Payer: Self-pay | Admitting: Internal Medicine

## 2012-04-03 ENCOUNTER — Telehealth: Payer: Self-pay | Admitting: Gastroenterology

## 2012-04-03 NOTE — Telephone Encounter (Signed)
Caller: [place]/Patient; Phone Number: [phone]; Message from caller: Pt is scheduled for June 29 for physical exam in [place] for his disability.  Wanted to know if he needs to go or not because he was informed by diability to ask the MD if he would need to go or not.

## 2012-04-03 NOTE — Telephone Encounter (Signed)
Pt wanted to know if he should have another physical to see if he can get disability. Discussed with pt that his PCP should really answer this question for him. Pt verbalized understanding.

## 2012-04-09 ENCOUNTER — Telehealth: Payer: Self-pay | Admitting: Internal Medicine

## 2012-04-09 NOTE — Telephone Encounter (Signed)
Caller: [place]/Patient; PCP: Sanda Linger; CB#: [phone]; ; ; Call regarding Dizziness; onset 3-4 days ago.  States has had more fatigue and less ability to tolerate activity than usual.  When he took lipitor, he had some spasms of the R forearm, but since stopping the lipitor it has improved, but still has some spasm of the arm and hand.  Denies chest pain, diffiulty breathing, or other emergent symptoms per protocol; advised appt within 24 hours.  Patient states he was given appt by office staff for 1600 04/09/12.  To keep that appt.

## 2012-04-10 ENCOUNTER — Ambulatory Visit (INDEPENDENT_AMBULATORY_CARE_PROVIDER_SITE_OTHER): Payer: Self-pay | Admitting: Internal Medicine

## 2012-04-10 ENCOUNTER — Encounter: Payer: Self-pay | Admitting: Internal Medicine

## 2012-04-10 ENCOUNTER — Other Ambulatory Visit (INDEPENDENT_AMBULATORY_CARE_PROVIDER_SITE_OTHER): Payer: Self-pay

## 2012-04-10 VITALS — BP 134/88 | HR 86 | Temp 98.1°F | Resp 16 | Wt 205.8 lb

## 2012-04-10 DIAGNOSIS — R7309 Other abnormal glucose: Secondary | ICD-10-CM

## 2012-04-10 DIAGNOSIS — B3781 Candidal esophagitis: Secondary | ICD-10-CM

## 2012-04-10 DIAGNOSIS — E7800 Pure hypercholesterolemia, unspecified: Secondary | ICD-10-CM

## 2012-04-10 DIAGNOSIS — D509 Iron deficiency anemia, unspecified: Secondary | ICD-10-CM

## 2012-04-10 DIAGNOSIS — R0602 Shortness of breath: Secondary | ICD-10-CM

## 2012-04-10 LAB — COMPREHENSIVE METABOLIC PANEL
Albumin: 3.8 g/dL (ref 3.5–5.2)
BUN: 21 mg/dL (ref 6–23)
CO2: 28 mEq/L (ref 19–32)
Calcium: 8.9 mg/dL (ref 8.4–10.5)
Chloride: 105 mEq/L (ref 96–112)
Creatinine, Ser: 1 mg/dL (ref 0.4–1.5)
GFR: 99.7 mL/min (ref >60.00)
Glucose, Bld: 91 mg/dL (ref 70–99)
Potassium: 3.8 mEq/L (ref 3.5–5.1)

## 2012-04-10 LAB — LIPID PANEL
Cholesterol: 196 mg/dL (ref 0–200)
LDL Cholesterol: 124 mg/dL — ABNORMAL HIGH (ref 0–99)
VLDL: 19.2 mg/dL (ref 0.0–40.0)

## 2012-04-10 LAB — CBC WITH DIFFERENTIAL/PLATELET
Basophils Relative: 1.1 % (ref 0.0–3.0)
Eosinophils Absolute: 0.1 10*3/uL (ref 0.0–0.7)
Hemoglobin: 11.6 g/dL — ABNORMAL LOW (ref 13.0–17.0)
Lymphocytes Relative: 32.8 % (ref 12.0–46.0)
MCHC: 33.1 g/dL (ref 30.0–36.0)
Monocytes Relative: 10.6 % (ref 3.0–12.0)
Neutro Abs: 4.2 10*3/uL (ref 1.4–7.7)
Neutrophils Relative %: 53.8 % (ref 43.0–77.0)
RBC: 4.05 Mil/uL — ABNORMAL LOW (ref 4.22–5.81)
WBC: 7.9 10*3/uL (ref 4.5–10.5)

## 2012-04-10 LAB — IBC PANEL
Iron: 85 ug/dL (ref 42–165)
Saturation Ratios: 28.4 % (ref 20.0–50.0)

## 2012-04-10 LAB — HEMOGLOBIN A1C: Hgb A1c MFr Bld: 6.3 % (ref 4.6–6.5)

## 2012-04-10 NOTE — Progress Notes (Signed)
Subjective:    Patient ID: [name], male    DOB: 11/02/1960, 51 y.o.   MRN: [id number]  Anemia Presents for follow-up visit. Symptoms include light-headedness and malaise/fatigue. There has been no abdominal pain, anorexia, bruising/bleeding easily, confusion, fever, leg swelling, pallor, palpitations, paresthesias, pica or weight loss. Signs of blood loss that are not present include hematemesis, hematochezia and melena. Compliance problems include psychosocial issues.       Review of Systems  Constitutional: Positive for malaise/fatigue. Negative for fever, chills, weight loss, diaphoresis, activity change, appetite change, fatigue and unexpected weight change.  HENT: Negative.   Eyes: Negative.   Respiratory: Positive for shortness of breath. Negative for apnea, cough, choking, chest tightness, wheezing and stridor.   Cardiovascular: Negative for chest pain, palpitations and leg swelling.  Gastrointestinal: Negative for nausea, vomiting, abdominal pain, diarrhea, constipation, blood in stool, melena, hematochezia, abdominal distention, anorexia and hematemesis.  Genitourinary: Negative.   Musculoskeletal: Negative for myalgias, back pain, joint swelling, arthralgias and gait problem.  Skin: Negative for color change, pallor, rash and wound.  Neurological: Positive for dizziness and light-headedness. Negative for tremors, seizures, syncope, facial asymmetry, speech difficulty, weakness, numbness, headaches and paresthesias.  Hematological: Negative for adenopathy. Does not bruise/bleed easily.  Psychiatric/Behavioral: Negative.  Negative for confusion.       Objective:   Physical Exam  Vitals reviewed. Constitutional: He is oriented to person, place, and time. He appears well-developed and well-nourished. No distress.  HENT:  Head: Normocephalic and atraumatic.  Mouth/Throat: Oropharynx is clear and moist. No oropharyngeal exudate.  Eyes: Conjunctivae are normal. Right eye  exhibits no discharge. Left eye exhibits no discharge. No scleral icterus.  Neck: Normal range of motion. Neck supple. No JVD present. No tracheal deviation present. No thyromegaly present.  Cardiovascular: Normal rate, regular rhythm, normal heart sounds and intact distal pulses.  Exam reveals no gallop and no friction rub.   No murmur heard. Pulmonary/Chest: Effort normal and breath sounds normal. No stridor. No respiratory distress. He has no wheezes. He has no rales. He exhibits no tenderness.  Abdominal: Soft. Bowel sounds are normal. He exhibits no distension and no mass. There is no tenderness. There is no rebound and no guarding. Hernia confirmed negative in the right inguinal area and confirmed negative in the left inguinal area.  Genitourinary: Rectum normal, prostate normal, testes normal and penis normal. Rectal exam shows no external hemorrhoid, no internal hemorrhoid, no fissure, no mass, no tenderness and anal tone normal. Guaiac negative stool. Prostate is not enlarged and not tender. Right testis shows no mass, no swelling and no tenderness. Right testis is descended. Left testis shows no mass, no swelling and no tenderness. Left testis is descended. Circumcised. No penile tenderness. No discharge found.  Musculoskeletal: Normal range of motion. He exhibits no edema.  Lymphadenopathy:    He has no cervical adenopathy.       Right: No inguinal adenopathy present.       Left: No inguinal adenopathy present.  Neurological: He is oriented to person, place, and time.  Skin: Skin is warm and dry. No rash noted. He is not diaphoretic. No erythema. No pallor.  Psychiatric: He has a normal mood and affect. His behavior is normal. Judgment and thought content normal.      Lab Results  Component Value Date   WBC 8.4 02/19/2011   HGB 12.1* 02/19/2011   HCT 35.8* 02/19/2011   PLT 210.0 02/19/2011   GLUCOSE 133* 10/30/2010   CHOL  Value:  177        ATP III CLASSIFICATION:  <200     mg/dL    Desirable  308-657  mg/dL   Borderline High  >=846    mg/dL   High 9/62/9528   TRIG 67 03/18/2008   HDL 50 03/18/2008   LDLCALC  Value: 114        Total Cholesterol/HDL:CHD Risk Coronary Heart Disease Risk Table                     Men   Women  1/2 Average Risk   3.4   3.3* 03/18/2008   ALT 24 10/30/2010   AST 29 10/30/2010   NA 140 10/30/2010   K 3.6 10/30/2010   CL 105 10/30/2010   CREATININE 0.94 10/30/2010   BUN 14 10/30/2010   CO2 28 10/30/2010   TSH 2.513 07/09/2010   PSA 1.13 07/06/2010   INR 1.03 07/10/2010   HGBA1C  Value: 6.2 (NOTE)                                                                       According to the ADA Clinical Practice Recommendations for 2011, when HbA1c is used as a screening test:   >=6.5%   Diagnostic of Diabetes Mellitus           (if abnormal result  is confirmed)  5.7-6.4%   Increased risk of developing Diabetes Mellitus  References:Diagnosis and Classification of Diabetes Mellitus,Diabetes Care,2011,34(Suppl 1):S62-S69 and Standards of Medical Care in         Diabetes - 2011,Diabetes Care,2011,34  (Suppl 1):S11-S61.* 06/26/2010      Assessment & Plan:

## 2012-04-10 NOTE — Patient Instructions (Signed)
Anemia, Nonspecific Your exam and blood tests show you are anemic. This means your blood (hemoglobin) level is low. Normal hemoglobin values are 12 to 15 g/dL for females and 14 to 17 g/dL for males. Make a note of your hemoglobin level today. The hematocrit percent is also used to measure anemia. A normal hematocrit is 38% to 46% in females and 42% to 49% in males. Make a note of your hematocrit level today. CAUSES  Anemia can be due to many different causes.  Excessive bleeding from periods (in women).   Intestinal bleeding.   Poor nutrition.   Kidney, thyroid, liver, and bone marrow diseases.  SYMPTOMS  Anemia can come on suddenly (acute). It can also come on slowly. Symptoms can include:  Minor weakness.   Dizziness.   Palpitations.   Shortness of breath.  Symptoms may be absent until half your hemoglobin is missing if it comes on slowly. Anemia due to acute blood loss from an injury or internal bleeding may require blood transfusion if the loss is severe. Hospital care is needed if you are anemic and there is significant continual blood loss. TREATMENT   Stool tests for blood (Hemoccult) and additional lab tests are often needed. This determines the best treatment.   Further checking on your condition and your response to treatment is very important. It often takes many weeks to correct anemia.  Depending on the cause, treatment can include:  Supplements of iron.   Vitamins B12 and folic acid.   Hormone medicines.If your anemia is due to bleeding, finding the cause of the blood loss is very important. This will help avoid further problems.  SEEK IMMEDIATE MEDICAL CARE IF:   You develop fainting, extreme weakness, shortness of breath, or chest pain.   You develop heavy vaginal bleeding.   You develop bloody or black, tarry stools or vomit up blood.   You develop a high fever, rash, repeated vomiting, or dehydration.  Document Released: 11/22/2004 Document Revised:  10/04/2011 Document Reviewed: 08/30/2009 ExitCare Patient Information 2012 [employer]. 

## 2012-04-11 ENCOUNTER — Encounter: Payer: Self-pay | Admitting: Internal Medicine

## 2012-04-11 LAB — TSH: TSH: 1.68 u[IU]/mL (ref 0.35–5.50)

## 2012-04-11 LAB — VITAMIN B12: Vitamin B-12: 578 pg/mL (ref 211–911)

## 2012-04-11 LAB — FERRITIN: Ferritin: 64.8 ng/mL (ref 22.0–322.0)

## 2012-04-12 NOTE — Assessment & Plan Note (Signed)
I will recheck his CBC and his iron level, I do not see any signs of blood loss today and he was previously seen by GI

## 2012-04-12 NOTE — Assessment & Plan Note (Signed)
I will check an a1c to see if he has developed DM II and will treat if needed

## 2012-04-12 NOTE — Assessment & Plan Note (Signed)
FLP CMP TSH today 

## 2012-04-16 ENCOUNTER — Telehealth: Payer: Self-pay

## 2012-04-16 NOTE — Telephone Encounter (Signed)
His vitamin levels were all normal so there is no med for this, we need to look for other causes of blood loss, inflammation, etc

## 2012-04-16 NOTE — Telephone Encounter (Signed)
Pt called stating he received results of labs and is requesting advisement from MD - should he be on any medications to treat anemia? Please advise.

## 2012-04-17 NOTE — Telephone Encounter (Signed)
Pt advised and understood. Will follow up with MD in office to discuss further.

## 2012-04-30 ENCOUNTER — Telehealth: Payer: Self-pay | Admitting: *Deleted

## 2012-04-30 ENCOUNTER — Emergency Department (HOSPITAL_COMMUNITY)
Admission: EM | Admit: 2012-04-30 | Discharge: 2012-04-30 | Disposition: A | Discharge status: Home / Self Care | Payer: Self-pay | Attending: Emergency Medicine | Admitting: Emergency Medicine

## 2012-04-30 ENCOUNTER — Encounter (HOSPITAL_COMMUNITY): Payer: Self-pay | Admitting: *Deleted

## 2012-04-30 DIAGNOSIS — I4891 Unspecified atrial fibrillation: Secondary | ICD-10-CM | POA: Insufficient documentation

## 2012-04-30 DIAGNOSIS — I10 Essential (primary) hypertension: Secondary | ICD-10-CM | POA: Insufficient documentation

## 2012-04-30 DIAGNOSIS — F17200 Nicotine dependence, unspecified, uncomplicated: Secondary | ICD-10-CM | POA: Insufficient documentation

## 2012-04-30 DIAGNOSIS — I480 Paroxysmal atrial fibrillation: Secondary | ICD-10-CM

## 2012-04-30 DIAGNOSIS — Z7982 Long term (current) use of aspirin: Secondary | ICD-10-CM | POA: Insufficient documentation

## 2012-04-30 HISTORY — DX: Essential (primary) hypertension: I10

## 2012-04-30 MED ORDER — FLECAINIDE ACETATE 100 MG PO TABS: 100.0000 mg | ORAL_TABLET | Freq: Two times a day (BID) | ORAL | Status: DC

## 2012-04-30 NOTE — ED Notes (Signed)
States "episode of irregular heart rate".  It was intermittent and did not radiated anywhere.  Has history of A fib but is not on medications at present.

## 2012-04-30 NOTE — ED Provider Notes (Signed)
History     CSN: [id number]  Arrival date & time 04/30/12  1716   First MD Initiated Contact with Patient 04/30/12 1802      Chief Complaint  Patient presents with  . Irregular Heart Beat    (Consider location/radiation/quality/duration/timing/severity/associated sxs/prior treatment) HPI Comments: Patient has a history of paroxysmal afib and sees Dr. Donnie Aho.  He has been on flecainide in the past but is not taking it now due to financial reasons.  Had two episodes of palpitations (afib) today that have since resolved.  Feels fine now.  Patient is a 51 y.o. male presenting with palpitations. The history is provided by the patient.  Palpitations  This is a recurrent problem. The current episode started 1 to 2 hours ago. The problem occurs rarely. The problem has been resolved. The problem is associated with an unknown factor.    Past Medical History  Diagnosis Date  . Hypotension   . Anemia   . Hypertension     Past Surgical History  Procedure Date  . Tonsillectomy   . Penile prosthesis implant     Family History  Problem Relation Age of Onset  . Diabetes Mother   . Stroke Mother   . Heart disease Mother   . Diabetes Father   . Diabetes Sister   . Cancer Other     Colon cancer    History  Substance Use Topics  . Smoking status: Current Some Day Smoker    Types: Cigars  . Smokeless tobacco: Never Used  . Alcohol Use: No      Review of Systems  Cardiovascular: Positive for palpitations.  All other systems reviewed and are negative.    Allergies  Review of patient's allergies indicates no known allergies.  Home Medications   Current Outpatient Rx  Name Route Sig Dispense Refill  . ASPIRIN 81 MG PO CHEW Oral Chew 81 mg by mouth daily.    Marland Kitchen ESOMEPRAZOLE MAGNESIUM 40 MG PO CPDR Oral Take 1 capsule (40 mg total) by mouth daily. 30 capsule 1  . OMEGA 3 1200 MG PO CAPS Oral Take 1 capsule by mouth daily.      BP 162/96  Pulse 72  Temp 99.3 F (37.4 C)  (Oral)  Resp 16  Ht 6' (1.829 m)  Wt 208 lb (94.348 kg)  BMI 28.21 kg/m2  SpO2 100%  Physical Exam  Nursing note and vitals reviewed. Constitutional: He is oriented to person, place, and time. He appears well-developed and well-nourished. No distress.  HENT:  Head: Normocephalic and atraumatic.  Mouth/Throat: Oropharynx is clear and moist.  Neck: Normal range of motion. Neck supple.  Cardiovascular: Normal rate and regular rhythm.   No murmur heard. Pulmonary/Chest: Effort normal and breath sounds normal. No respiratory distress.  Abdominal: Soft. Bowel sounds are normal. He exhibits no distension. There is no tenderness.  Musculoskeletal: Normal range of motion. He exhibits no edema.  Neurological: He is alert and oriented to person, place, and time.  Skin: Skin is warm and dry. He is not diaphoretic.    ED Course  Procedures (including critical care time)  Labs Reviewed - No data to display No results found.   No diagnosis found.   Date: 04/30/2012  Rate: 66  Rhythm: normal sinus rhythm  QRS Axis: normal  Intervals: normal  ST/T Wave abnormalities: normal  Conduction Disutrbances:none  Narrative Interpretation:   Old EKG Reviewed: unchanged    MDM  The patient presents after two episodes of afib that have  since resolved spontaneously.  He is now in sinus rhythm.  He has been worked up for this in the past with an echo, cath, and most recently had blood work two weeks ago that was normal.  He has these reports with him and all look okay.  He had no chest pain.    It appears to me that he just needs to be back on his flecainide.  I will prescribe this for him and recommend he follow up with Dr. Donnie Aho in the next 1-2 weeks, return if he has problems.        Geoffery Lyons, MD 04/30/12 [phone]

## 2012-04-30 NOTE — ED Notes (Signed)
Patient discharged home with written and verbal instruction.  No questions or concerns at discharge. 

## 2012-04-30 NOTE — Telephone Encounter (Signed)
Pt to come pick up samples on Monday

## 2012-05-07 ENCOUNTER — Ambulatory Visit: Payer: Self-pay | Admitting: Internal Medicine

## 2012-05-12 ENCOUNTER — Ambulatory Visit: Payer: Self-pay | Admitting: Internal Medicine

## 2012-05-20 ENCOUNTER — Other Ambulatory Visit (INDEPENDENT_AMBULATORY_CARE_PROVIDER_SITE_OTHER): Payer: Self-pay

## 2012-05-20 ENCOUNTER — Ambulatory Visit (INDEPENDENT_AMBULATORY_CARE_PROVIDER_SITE_OTHER): Payer: Self-pay | Admitting: Internal Medicine

## 2012-05-20 ENCOUNTER — Encounter: Payer: Self-pay | Admitting: Internal Medicine

## 2012-05-20 VITALS — BP 128/88 | HR 74 | Temp 97.4°F | Resp 16 | Wt 206.0 lb

## 2012-05-20 DIAGNOSIS — E7800 Pure hypercholesterolemia, unspecified: Secondary | ICD-10-CM

## 2012-05-20 DIAGNOSIS — D509 Iron deficiency anemia, unspecified: Secondary | ICD-10-CM

## 2012-05-20 LAB — CBC WITH DIFFERENTIAL/PLATELET
Basophils Absolute: 0.1 10*3/uL (ref 0.0–0.1)
Eosinophils Absolute: 0.2 10*3/uL (ref 0.0–0.7)
Lymphocytes Relative: 29.2 % (ref 12.0–46.0)
MCHC: 32.9 g/dL (ref 30.0–36.0)
Monocytes Relative: 10.2 % (ref 3.0–12.0)
Neutrophils Relative %: 56.8 % (ref 43.0–77.0)
RBC: 4.14 Mil/uL — ABNORMAL LOW (ref 4.22–5.81)
RDW: 15.9 % — ABNORMAL HIGH (ref 11.5–14.6)

## 2012-05-20 MED ORDER — ROSUVASTATIN CALCIUM 5 MG PO TABS: 5.0000 mg | ORAL_TABLET | Freq: Every day | ORAL | Status: DC

## 2012-05-20 NOTE — Patient Instructions (Signed)
Anemia, Nonspecific Your exam and blood tests show you are anemic. This means your blood (hemoglobin) level is low. Normal hemoglobin values are 12 to 15 g/dL for females and 14 to 17 g/dL for males. Make a note of your hemoglobin level today. The hematocrit percent is also used to measure anemia. A normal hematocrit is 38% to 46% in females and 42% to 49% in males. Make a note of your hematocrit level today. CAUSES  Anemia can be due to many different causes.  Excessive bleeding from periods (in women).   Intestinal bleeding.   Poor nutrition.   Kidney, thyroid, liver, and bone marrow diseases.  SYMPTOMS  Anemia can come on suddenly (acute). It can also come on slowly. Symptoms can include:  Minor weakness.   Dizziness.   Palpitations.   Shortness of breath.  Symptoms may be absent until half your hemoglobin is missing if it comes on slowly. Anemia due to acute blood loss from an injury or internal bleeding may require blood transfusion if the loss is severe. Hospital care is needed if you are anemic and there is significant continual blood loss. TREATMENT   Stool tests for blood (Hemoccult) and additional lab tests are often needed. This determines the best treatment.   Further checking on your condition and your response to treatment is very important. It often takes many weeks to correct anemia.  Depending on the cause, treatment can include:  Supplements of iron.   Vitamins B12 and folic acid.   Hormone medicines.If your anemia is due to bleeding, finding the cause of the blood loss is very important. This will help avoid further problems.  SEEK IMMEDIATE MEDICAL CARE IF:   You develop fainting, extreme weakness, shortness of breath, or chest pain.   You develop heavy vaginal bleeding.   You develop bloody or black, tarry stools or vomit up blood.   You develop a high fever, rash, repeated vomiting, or dehydration.  Document Released: 11/22/2004 Document Revised:  10/04/2011 Document Reviewed: 08/30/2009 ExitCare Patient Information 2012 [employer]. 

## 2012-05-20 NOTE — Progress Notes (Signed)
  Subjective:    Patient ID: [name], male    DOB: November 20, 1960, 51 y.o.   MRN: [id number]  Anemia Presents for follow-up visit. There has been no abdominal pain, anorexia, bruising/bleeding easily, confusion, fever, leg swelling, light-headedness, malaise/fatigue, pallor, palpitations, paresthesias, pica or weight loss. Signs of blood loss that are not present include hematemesis, hematochezia and melena.      Review of Systems  Constitutional: Negative.  Negative for fever, weight loss and malaise/fatigue.  HENT: Negative.   Eyes: Negative.   Respiratory: Negative.  Negative for chest tightness, shortness of breath, wheezing and stridor.   Cardiovascular: Negative.  Negative for chest pain, palpitations and leg swelling.  Gastrointestinal: Negative.  Negative for nausea, vomiting, abdominal pain, diarrhea, constipation, blood in stool, melena, hematochezia, anorexia and hematemesis.  Genitourinary: Negative.   Musculoskeletal: Negative.   Skin: Negative.  Negative for color change, pallor, rash and wound.  Neurological: Negative.  Negative for light-headedness and paresthesias.  Hematological: Negative.  Negative for adenopathy. Does not bruise/bleed easily.  Psychiatric/Behavioral: Negative.  Negative for confusion.       Objective:   Physical Exam  Vitals reviewed. Constitutional: He is oriented to person, place, and time. He appears well-developed and well-nourished. No distress.  HENT:  Head: Normocephalic and atraumatic.  Mouth/Throat: Oropharynx is clear and moist. No oropharyngeal exudate.  Eyes: Conjunctivae are normal. Right eye exhibits no discharge. Left eye exhibits no discharge. No scleral icterus.  Neck: Normal range of motion. Neck supple. No JVD present. No tracheal deviation present. No thyromegaly present.  Cardiovascular: Normal rate, regular rhythm, normal heart sounds and intact distal pulses.  Exam reveals no gallop and no friction rub.   No murmur  heard. Pulmonary/Chest: Effort normal and breath sounds normal. No stridor. No respiratory distress. He has no wheezes. He has no rales. He exhibits no tenderness.  Abdominal: Soft. Bowel sounds are normal. He exhibits no distension and no mass. There is no tenderness. There is no rebound and no guarding.  Musculoskeletal: Normal range of motion. He exhibits no edema and no tenderness.  Lymphadenopathy:    He has no cervical adenopathy.  Neurological: He is oriented to person, place, and time.  Skin: Skin is warm and dry. No rash noted. He is not diaphoretic. No erythema. No pallor.  Psychiatric: He has a normal mood and affect. His behavior is normal. Judgment and thought content normal.     Lab Results  Component Value Date   WBC 7.9 04/10/2012   HGB 11.6* 04/10/2012   HCT 35.2* 04/10/2012   PLT 230.0 04/10/2012   GLUCOSE 91 04/10/2012   CHOL 196 04/10/2012   TRIG 96.0 04/10/2012   HDL 52.90 04/10/2012   LDLCALC 124* 04/10/2012   ALT 14 04/10/2012   AST 19 04/10/2012   NA 141 04/10/2012   K 3.8 04/10/2012   CL 105 04/10/2012   CREATININE 1.0 04/10/2012   BUN 21 04/10/2012   CO2 28 04/10/2012   TSH 1.68 04/10/2012   PSA 1.13 07/06/2010   INR 1.03 07/10/2010   HGBA1C 6.3 04/10/2012       Assessment & Plan:

## 2012-05-23 NOTE — Assessment & Plan Note (Signed)
I have asked him to start crestor for risk reduction 

## 2012-05-23 NOTE — Assessment & Plan Note (Signed)
He has no s/s of anemia or blood loss, I will recheck his CBC today

## 2012-05-30 ENCOUNTER — Telehealth: Payer: Self-pay | Admitting: *Deleted

## 2012-05-30 MED ORDER — ESOMEPRAZOLE MAGNESIUM 40 MG PO CPDR: 40.0000 mg | DELAYED_RELEASE_CAPSULE | Freq: Every day | ORAL | Status: DC

## 2012-05-30 NOTE — Telephone Encounter (Signed)
L/M for patient the Nexium was sent

## 2012-06-24 ENCOUNTER — Telehealth: Payer: Self-pay

## 2012-06-24 DIAGNOSIS — I480 Paroxysmal atrial fibrillation: Secondary | ICD-10-CM

## 2012-06-24 NOTE — Telephone Encounter (Signed)
Received refill request for flecainide 100 mg BID. Rx was given by Dr.Delo Riley Lam at a 04/30/12 ED visit. Please advise Thanks

## 2012-06-25 DIAGNOSIS — I480 Paroxysmal atrial fibrillation: Secondary | ICD-10-CM | POA: Insufficient documentation

## 2012-06-25 MED ORDER — FLECAINIDE ACETATE 100 MG PO TABS: 100.0000 mg | ORAL_TABLET | Freq: Two times a day (BID) | ORAL | Status: DC

## 2012-06-25 NOTE — Telephone Encounter (Signed)
Called patient no answer// cell number states a different person name and home number does not except any calls per voice prompt. Pcc to notify of cardiology appt once scheduled

## 2012-06-25 NOTE — Telephone Encounter (Signed)
Refill sent to his pharmacy, he needs to see cardiology asap

## 2012-06-26 ENCOUNTER — Telehealth: Payer: Self-pay | Admitting: Gastroenterology

## 2012-06-26 NOTE — Telephone Encounter (Signed)
Called pt to inform him to come pick up samples. There was no answer

## 2012-07-23 ENCOUNTER — Ambulatory Visit (INDEPENDENT_AMBULATORY_CARE_PROVIDER_SITE_OTHER): Payer: Self-pay | Admitting: Cardiovascular Disease

## 2012-07-23 ENCOUNTER — Encounter: Payer: Self-pay | Admitting: Cardiovascular Disease

## 2012-07-23 VITALS — BP 118/81 | Ht 72.0 in | Wt 208.0 lb

## 2012-07-23 DIAGNOSIS — I480 Paroxysmal atrial fibrillation: Secondary | ICD-10-CM

## 2012-07-23 DIAGNOSIS — I4891 Unspecified atrial fibrillation: Secondary | ICD-10-CM

## 2012-07-23 MED ORDER — METOPROLOL TARTRATE 50 MG PO TABS: 50.0000 mg | ORAL_TABLET | Freq: Two times a day (BID) | ORAL | Status: DC

## 2012-07-23 MED ORDER — FLECAINIDE ACETATE 100 MG PO TABS: ORAL_TABLET | ORAL | Status: DC

## 2012-07-23 NOTE — Progress Notes (Signed)
History of Present Illness: 51 yo AAM with history of paroxysmal atrial fibrillation, hyperlipidemia who is a self referral today to establish care in our office. He has been followed in the past by Dr. Viann Fish. He has been on Flecainide for many years. Cardiac cath 03/23/2008 with normal coronary arteries. Echo 07/10/10 with normal LV size and function. No significant valvular issues. He was seen in the ED 04/30/12 with c/o palpitations. He had been out of Flecainide due to financial issues. He was found to be in sinus rhythm during the ED visit. He was given refills for his Flecainide. He took this every day and had on issues. He has since run out of this medication again. Review of his records shows that he was last seen in Dr. York Spaniel office on 07/21/10 and he was on Metoprolol 50 mg po BID at that time. He reports that he was feeling well but stopped the metoprolol.   He has been feeling well. No chest pain or SOB. No awareness of palpitations over the last two months. He has not been taking flecainide or metoprolol. No dizziness, near syncope or syncope.   Primary Care Physician: Santa Genera  Last Lipid Profile:Lipid Panel     Component Value Date/Time   CHOL 196 04/10/2012 1657   TRIG 96.0 04/10/2012 1657   HDL 52.90 04/10/2012 1657   CHOLHDL 4 04/10/2012 1657   VLDL 19.2 04/10/2012 1657   LDLCALC 124* 04/10/2012 1657     Past Medical History  Diagnosis Date  . Anemia   . hyperlipidemia   . Paroxysmal atrial fibrillation     Past Surgical History  Procedure Date  . Tonsillectomy   . Penile prosthesis implant   . Hernia repair     Current Outpatient Prescriptions  Medication Sig Dispense Refill  . aspirin 81 MG chewable tablet Chew 81 mg by mouth daily.      Marland Kitchen esomeprazole (NEXIUM) 40 MG capsule Take 1 capsule (40 mg total) by mouth daily.  30 capsule  11  . flecainide (TAMBOCOR) 100 MG tablet Take 1 tablet (100 mg total) by mouth 2 (two) times daily.  60 tablet  1  .  OMEGA 3 1200 MG CAPS Take 1 capsule by mouth daily.      . rosuvastatin (CRESTOR) 5 MG tablet Take 1 tablet (5 mg total) by mouth daily.  126 tablet  0    No Known Allergies  History   Social History  . Marital Status: Divorced    Spouse Name: N/A    Number of Children: 0  . Years of Education: N/A   Occupational History  . ministor    Social History Main Topics  . Smoking status: Current Some Day Smoker    Types: Cigars  . Smokeless tobacco: Never Used  . Alcohol Use: No  . Drug Use: No  . Sexually Active: Not Currently   Other Topics Concern  . Not on file   Social History Narrative  . No narrative on file    Family History  Problem Relation Age of Onset  . Diabetes Mother   . Stroke Mother   . Heart disease Mother   . Diabetes Father   . Diabetes Sister   . Cancer Father     Prostate cancer    Review of Systems:  As stated in the HPI and otherwise negative.   BP 118/81  Ht 6' (1.829 m)  Wt 208 lb (94.348 kg)  BMI 28.21 kg/m2  Physical  Examination: General: Well developed, well nourished, NAD HEENT: OP clear, mucus membranes moist SKIN: warm, dry. No rashes. Neuro: No focal deficits Musculoskeletal: Muscle strength 5/5 all ext Psychiatric: Mood and affect normal Neck: No JVD, no carotid bruits, no thyromegaly, no lymphadenopathy. Lungs:Clear bilaterally, no wheezes, rhonci, crackles Cardiovascular: Regular rate and rhythm. No murmurs, gallops or rubs. Abdomen:Soft. Bowel sounds present. Non-tender.  Extremities: No lower extremity edema. Pulses are 2 + in the bilateral DP/PT.  EKG: NSR, rate 77 bpm.   Cardiac cath 03/23/08: Dr. Donnie Aho ANGIOGRAPHIC DATA: Left ventriculogram: Performed in the 30 degree RAO  projection. The aortic valve is normal. The mitral valve is normal.  The left ventricle appears normal in size. Estimated ejection fraction  is 55%. Coronary arteries arise and distribute normally. There is no  significant coronary artery  calcification noted. The left main coronary  artery is normal. Left anterior descending is a large vessel and  contains no significant obstructive disease. The circumflex coronary  artery has a large intermediate branch that is free of disease. The  circumflex has two coronary arteries. He has two marginal arteries.  The right coronary artery is a dominant vessel that contains no  significant disease.  Echo 07/10/10:  Left ventricle: The cavity size was normal. Systolic function was normal. The estimated ejection fraction was in the range of 55% to 60%. Wall motion was normal; there were no regional wall motion abnormalities.   Assessment and Plan:   1. Paroxysmal atrial fibrillation: This has been present for years and followed by Dr. Donnie Aho. Will resume Metoprolol tartrate 50 mg po BID and will use "pill in pocket" approach with Flecainide 100 mg po Q12 hours as needed for irregularity of his heart rhythm. His CHADs2 score is 0. Will continue ASA 81 mg po Qdaily.

## 2012-07-23 NOTE — Patient Instructions (Addendum)
Your physician wants you to follow-up in:  12 months. You will receive a reminder letter in the mail two months in advance. If you don't receive a letter, please call our office to schedule the follow-up appointment.  Your physician has recommended you make the following change in your medication:Start lopressor (metoprolol tartrate) 50 mg by mouth twice daily.     May take flecainide 100 mg by mouth twice daily as needed for palpitations

## 2012-08-14 ENCOUNTER — Telehealth: Payer: Self-pay | Admitting: Cardiovascular Disease

## 2012-08-14 NOTE — Telephone Encounter (Signed)
Spoke with pt and told him samples would be at front desk for him to pick up. Crestor 5mg --28 tablet, Lot [id number], exp. 3/16 left at front desk.

## 2012-08-14 NOTE — Telephone Encounter (Signed)
Pt needs samples of crestor 5mg . I explained to pt you where in clinic and if we had samples you would leave it up front and he could pick it up tomorrow when he brings his mother for her appt and if we didn't have any you would call and tell him

## 2012-08-21 ENCOUNTER — Telehealth: Payer: Self-pay | Admitting: Gastroenterology

## 2012-08-21 NOTE — Telephone Encounter (Signed)
Called pt to pick up samples 

## 2012-09-18 ENCOUNTER — Telehealth: Payer: Self-pay | Admitting: Gastroenterology

## 2012-09-18 NOTE — Telephone Encounter (Signed)
Pt picked up samples today.

## 2012-10-28 ENCOUNTER — Other Ambulatory Visit: Payer: Self-pay | Admitting: Gastroenterology

## 2012-10-28 NOTE — Telephone Encounter (Signed)
Pt is aware and will be here to pick up samples at the front desk

## 2012-10-28 NOTE — Telephone Encounter (Signed)
Left message on machine to call back  

## 2012-10-28 NOTE — Telephone Encounter (Signed)
Samples at the front desk for pick up  

## 2012-11-13 ENCOUNTER — Telehealth: Payer: Self-pay | Admitting: Cardiovascular Disease

## 2012-11-13 ENCOUNTER — Telehealth: Payer: Self-pay | Admitting: Internal Medicine

## 2012-11-13 NOTE — Telephone Encounter (Signed)
Patient is calling to request samples of Crestor

## 2012-11-13 NOTE — Telephone Encounter (Signed)
Pt requesting samples of crestor 5 mg

## 2012-11-13 NOTE — Telephone Encounter (Signed)
LMOVM to pick up 

## 2012-11-13 NOTE — Telephone Encounter (Signed)
Samples left at front desk and pt notified.

## 2012-11-27 ENCOUNTER — Telehealth: Payer: Self-pay | Admitting: Gastroenterology

## 2012-11-27 NOTE — Telephone Encounter (Signed)
Patient can pick up samples

## 2012-11-28 NOTE — Telephone Encounter (Signed)
Patient to come get samples

## 2012-12-01 NOTE — Telephone Encounter (Signed)
meds out front for pt

## 2012-12-19 ENCOUNTER — Telehealth: Payer: Self-pay | Admitting: Gastroenterology

## 2012-12-24 NOTE — Telephone Encounter (Signed)
Have tried to contact pt. On several occasions. Usually he just stops in.

## 2013-01-12 ENCOUNTER — Telehealth: Payer: Self-pay | Admitting: Cardiovascular Disease

## 2013-01-12 NOTE — Telephone Encounter (Signed)
We do not have any crestor samples in any mg. Pt is aware .

## 2013-01-12 NOTE — Telephone Encounter (Signed)
New Problem:    Patient called in wanting to know if we had any samples of rosuvastatin (CRESTOR) 5 MG tablet available for him to have.  Please call back.

## 2013-01-13 ENCOUNTER — Telehealth: Payer: Self-pay | Admitting: *Deleted

## 2013-01-13 ENCOUNTER — Other Ambulatory Visit: Payer: Self-pay

## 2013-01-13 MED ORDER — ROSUVASTATIN CALCIUM 5 MG PO TABS: 5.0000 mg | ORAL_TABLET | Freq: Every day | ORAL | Status: DC

## 2013-01-13 NOTE — Telephone Encounter (Signed)
Pt came into clinic requesting samples of Crestor 5mg . 28 day supply given to pt

## 2013-01-13 NOTE — Telephone Encounter (Signed)
Pt called requesting samples of medication, unsure of name of medication. Left message for pt to callback office with name of medication.

## 2013-01-19 ENCOUNTER — Telehealth: Payer: Self-pay | Admitting: Gastroenterology

## 2013-01-19 NOTE — Telephone Encounter (Signed)
Patient will pick up samples todsy

## 2013-02-17 ENCOUNTER — Telehealth: Payer: Self-pay | Admitting: Gastroenterology

## 2013-02-17 NOTE — Telephone Encounter (Signed)
CALLED PT TO PICK UP SAMPLES

## 2013-03-12 ENCOUNTER — Telehealth: Payer: Self-pay | Admitting: Gastroenterology

## 2013-03-13 MED ORDER — ESOMEPRAZOLE MAGNESIUM 40 MG PO CPDR: 40.0000 mg | DELAYED_RELEASE_CAPSULE | Freq: Every day | ORAL | Status: DC

## 2013-03-13 NOTE — Telephone Encounter (Signed)
Patient came in and picked up samples

## 2013-03-18 ENCOUNTER — Telehealth: Payer: Self-pay | Admitting: Gastroenterology

## 2013-03-18 NOTE — Telephone Encounter (Signed)
The patients states that [facility] is going to start selling the purple pill.  He wants to know if it is the same as his Nexium.

## 2013-03-18 NOTE — Telephone Encounter (Signed)
Discussed with pt that as long as the medication states it is esomeprazole 40mg  then it is the same medication. Pt verbalized understanding.

## 2013-03-20 ENCOUNTER — Telehealth: Payer: Self-pay

## 2013-03-20 NOTE — Telephone Encounter (Signed)
Phone call from pt requesting samples of Crestor 5 mg. I let him know these will be placed at the front desk. He says he will be by to pick them up on next Tuesday.

## 2013-03-25 ENCOUNTER — Telehealth: Payer: Self-pay | Admitting: Cardiovascular Disease

## 2013-03-25 NOTE — Telephone Encounter (Signed)
New Prob      Calling in requesting some samples of CRESTOR.

## 2013-03-25 NOTE — Telephone Encounter (Signed)
Spoke with pt and told him I would leave samples of Crestor at front desk for him to pick up.  Crestor 5 mg- 28 tablets, exp. 12/16, Lot [id number] placed at front desk.

## 2013-04-14 ENCOUNTER — Telehealth: Payer: Self-pay | Admitting: Gastroenterology

## 2013-04-14 NOTE — Telephone Encounter (Signed)
Left samples out front for pt

## 2013-04-23 ENCOUNTER — Emergency Department (HOSPITAL_COMMUNITY)
Admission: EM | Admit: 2013-04-23 | Discharge: 2013-04-23 | Disposition: A | Discharge status: Home / Self Care | Payer: Self-pay | Attending: Emergency Medicine | Admitting: Emergency Medicine

## 2013-04-23 ENCOUNTER — Encounter (HOSPITAL_COMMUNITY): Payer: Self-pay | Admitting: *Deleted

## 2013-04-23 DIAGNOSIS — F17200 Nicotine dependence, unspecified, uncomplicated: Secondary | ICD-10-CM | POA: Insufficient documentation

## 2013-04-23 DIAGNOSIS — H6120 Impacted cerumen, unspecified ear: Secondary | ICD-10-CM | POA: Insufficient documentation

## 2013-04-23 DIAGNOSIS — Z8639 Personal history of other endocrine, nutritional and metabolic disease: Secondary | ICD-10-CM | POA: Insufficient documentation

## 2013-04-23 DIAGNOSIS — R253 Fasciculation: Secondary | ICD-10-CM

## 2013-04-23 DIAGNOSIS — R259 Unspecified abnormal involuntary movements: Secondary | ICD-10-CM | POA: Insufficient documentation

## 2013-04-23 DIAGNOSIS — Z862 Personal history of diseases of the blood and blood-forming organs and certain disorders involving the immune mechanism: Secondary | ICD-10-CM | POA: Insufficient documentation

## 2013-04-23 DIAGNOSIS — Z79899 Other long term (current) drug therapy: Secondary | ICD-10-CM | POA: Insufficient documentation

## 2013-04-23 DIAGNOSIS — R209 Unspecified disturbances of skin sensation: Secondary | ICD-10-CM | POA: Insufficient documentation

## 2013-04-23 DIAGNOSIS — I4891 Unspecified atrial fibrillation: Secondary | ICD-10-CM | POA: Insufficient documentation

## 2013-04-23 DIAGNOSIS — Z7982 Long term (current) use of aspirin: Secondary | ICD-10-CM | POA: Insufficient documentation

## 2013-04-23 DIAGNOSIS — R202 Paresthesia of skin: Secondary | ICD-10-CM

## 2013-04-23 NOTE — ED Provider Notes (Signed)
History    This chart was scribed for non-physician practitioner Dierdre Forth, PA working with Gilda Crease, * by Quintella Reichert, ED Scribe. This patient was seen in room TR06C/TR06C and the patient's care was started at 6:29 PM .   CSN: [id number]  Arrival date & time 04/23/13  1528    Chief Complaint  Patient presents with  . Tingling    The history is provided by the patient. No language interpreter was used.    HPI Comments: [name] is a 52 y.o. male with h/o paroxysmal atrial fibrillation, hyperlipidemia, and anemia who presents to the Emergency Department complaining of several isolated episodes in the past 2 weeks of tingling and twitching to the right forearm and to the right lower lip.  He describes arm symptoms as "nerve jumping" or jerking that occurs in episodes lasting 1-2 minutes before resolving on its own.  These episodes have occurred two times in the past two weeks.  He describes lip symptoms as tingling or twitching to the right lower lip that occurred one time 2 weeks ago and has not since recurred.  He denies difficulty speaking, difficulty walking, fever, chills, nausea, emesis, abdominal pain, or any other associated symptoms.  Presently pt is not experiencing symptoms.  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  He reports a family h/o stroke (mother) but denies a personal history.  He has not consulted with his PCP about his present symptoms.  Cardiologist is Dr. Clifton James PCP is Dr. Yetta Barre   Past Medical History  Diagnosis Date  . Anemia   . hyperlipidemia   . Paroxysmal atrial fibrillation     Past Surgical History  Procedure Laterality Date  . Tonsillectomy    . Penile prosthesis implant    . Hernia repair      Family History  Problem Relation Age of Onset  . Diabetes Mother    . Stroke Mother   . Heart disease Mother   . Diabetes Father   . Diabetes Sister   . Cancer Father     Prostate cancer    History  Substance Use Topics  . Smoking status: Current Some Day Smoker    Types: Cigars  . Smokeless tobacco: Never Used  . Alcohol Use: No      Review of Systems  Constitutional: Negative for fever, diaphoresis, appetite change, fatigue and unexpected weight change.  HENT: Negative for mouth sores and neck stiffness.   Eyes: Negative for visual disturbance.  Respiratory: Negative for cough, chest tightness, shortness of breath and wheezing.   Cardiovascular: Negative for chest pain.  Gastrointestinal: Negative for nausea, vomiting, abdominal pain, diarrhea and constipation.  Endocrine: Negative for polydipsia, polyphagia and polyuria.  Genitourinary: Negative for dysuria, urgency, frequency and hematuria.  Musculoskeletal: Negative for back pain.  Skin: Negative for rash.  Allergic/Immunologic: Negative for immunocompromised state.  Neurological: Negative for tremors, syncope, speech difficulty, weakness, light-headedness and headaches.       Episodic tingling and twitching  Hematological: Does not bruise/bleed easily.  Psychiatric/Behavioral: Negative for sleep disturbance. The patient is not nervous/anxious.        Allergies  Review of patient's allergies indicates no known allergies.  Home Medications   Current Outpatient Rx  Name  Route  Sig  Dispense  Refill  . aspirin 81 MG chewable tablet   Oral  Chew 81 mg by mouth daily.         Marland Kitchen esomeprazole (NEXIUM) 40 MG capsule   Oral   Take 1 capsule (40 mg total) by mouth daily.   30 capsule   11   . flecainide (TAMBOCOR) 100 MG tablet      Take one tablet by mouth two times daily as needed for palpitations   10 tablet   6   . metoprolol (LOPRESSOR) 50 MG tablet   Oral   Take 1 tablet (50 mg total) by mouth 2 (two) times daily.   60 tablet   11   . rosuvastatin (CRESTOR) 5  MG tablet   Oral   Take 1 tablet (5 mg total) by mouth daily.   28 tablet   0    BP 117/77  Pulse 81  Temp(Src) 98.4 F (36.9 C) (Oral)  Resp 18  SpO2 99%  Physical Exam  Nursing note and vitals reviewed. Constitutional: He is oriented to person, place, and time. He appears well-developed and well-nourished. No distress.  HENT:  Head: Normocephalic and atraumatic.  Right Ear: Tympanic membrane normal. Tympanic membrane is not erythematous and not bulging.  Mouth/Throat: Oropharynx is clear and moist. No oropharyngeal exudate.  Left TM has cerumen impaction  Eyes: Conjunctivae and EOM are normal. Pupils are equal, round, and reactive to light. No scleral icterus.  Neck: Normal range of motion. Neck supple.  Cardiovascular: Normal rate, regular rhythm and intact distal pulses.   Pulmonary/Chest: Effort normal and breath sounds normal. No respiratory distress. He has no wheezes. He has no rales.  Abdominal: Soft. Bowel sounds are normal. He exhibits no mass. There is no tenderness. There is no rebound and no guarding.  Musculoskeletal: Normal range of motion. He exhibits no edema.  Lymphadenopathy:    He has no cervical adenopathy.  Neurological: He is alert and oriented to person, place, and time. He has normal reflexes. No cranial nerve deficit. He exhibits normal muscle tone. Coordination normal.  Speech is clear and goal oriented, follows commands Cranial nerves III - XII without deficit, no facial droop Normal strength in upper and lower extremities bilaterally, strong and equal grip strength Sensation normal to light and sharp touch Moves extremities without ataxia, coordination intact Normal finger to nose and rapid alternating movements Neg romberg, no pronator drift Normal gait Normal heel-shin and balance   Skin: Skin is warm and dry. No rash noted. He is not diaphoretic.  Psychiatric: He has a normal mood and affect. His behavior is normal. Judgment and thought  content normal.    ED Course  Procedures (including critical care time)  DIAGNOSTIC STUDIES: Oxygen Saturation is 99% on room air, normal by my interpretation.    COORDINATION OF CARE: 6:34 PM-Discussed treatment plan which includes f/u with PCP early next week with pt at bedside and pt agreed to plan.      Labs Reviewed - No data to display  No results found.  1. Muscle twitching   2. Numbness and tingling of right face     MDM  Particia Nearing presents with atypical neurologic complaints, completely resolved, intermittent and infrequent in nature. Patient with normal neurological exam. Risk factors for CVA include hyperlipidemia and family history no personal history of CVA.  No evidence of CVA on exam.  Patient without intention tremor or resting tremor. Normal sensation throughout face and no cranial nerve deficit.  No indication for acute imaging.  Dr. Blinda Leatherwood was consulted agrees with the  plan to d/c home with close PCP follow-up and no imaging today.  I have also discussed reasons to return immediately to the ER, including warning signs of a CVA.  Patient expresses understanding and agrees with plan.  I personally performed the services described in this documentation, which was scribed in my presence. The recorded information has been reviewed and is accurate.    Dahlia Client Alfons Sulkowski, PA-C 04/23/13 2354

## 2013-04-23 NOTE — ED Notes (Signed)
Pt reports (R) arm twitching and (R) side of lip twitching x several days.  No deficits note.  States that he has no pain.

## 2013-04-27 NOTE — ED Provider Notes (Signed)
Medical screening examination/treatment/procedure(s) were performed by non-physician practitioner and as supervising physician I was immediately available for consultation/collaboration.    Gilda Crease, MD 04/27/13 1538

## 2013-05-04 ENCOUNTER — Telehealth: Payer: Self-pay | Admitting: Gastroenterology

## 2013-05-04 NOTE — Telephone Encounter (Signed)
Explained to pt that we are currently out of Nexium samples. [name] he had enough till sunday

## 2013-05-13 ENCOUNTER — Telehealth: Payer: Self-pay | Admitting: *Deleted

## 2013-05-13 ENCOUNTER — Telehealth: Payer: Self-pay | Admitting: Gastroenterology

## 2013-05-13 NOTE — Telephone Encounter (Signed)
Pt called requesting Crestor 5mg  samples.  Medication ready for pick up.

## 2013-05-13 NOTE — Telephone Encounter (Signed)
No samples- pt aware. 

## 2013-06-04 ENCOUNTER — Telehealth: Payer: Self-pay | Admitting: Gastroenterology

## 2013-06-04 ENCOUNTER — Ambulatory Visit (INDEPENDENT_AMBULATORY_CARE_PROVIDER_SITE_OTHER): Payer: Self-pay | Admitting: Cardiovascular Disease

## 2013-06-04 ENCOUNTER — Encounter: Payer: Self-pay | Admitting: Cardiovascular Disease

## 2013-06-04 VITALS — BP 134/90 | HR 72 | Ht 72.0 in | Wt 227.0 lb

## 2013-06-04 DIAGNOSIS — I480 Paroxysmal atrial fibrillation: Secondary | ICD-10-CM

## 2013-06-04 DIAGNOSIS — I4891 Unspecified atrial fibrillation: Secondary | ICD-10-CM

## 2013-06-04 NOTE — Patient Instructions (Addendum)
Your physician wants you to follow-up in:  12 months.  You will receive a reminder letter in the mail two months in advance. If you don't receive a letter, please call our office to schedule the follow-up appointment.   

## 2013-06-04 NOTE — Progress Notes (Signed)
History of Present Illness: 52 yo AAM with history of paroxysmal atrial fibrillation, hyperlipidemia who is here today for cardiac follow up. I saw him as a new patient 07/23/12. He dbeen followed in the past by Dr. Viann Fish. He has been on Flecainide for many years. Cardiac cath 03/23/2008 with normal coronary arteries. Echo 07/10/10 with normal LV size and function. No significant valvular issues. He was seen in the ED 04/30/12 with c/o palpitations. He had been out of Flecainide due to financial issues. He was found to be in sinus rhythm during the ED visit. When I met him he was out of his meds again.   He has been feeling well. No chest pain or SOB. No awareness of palpitations over the last two months. He has not been taking flecainide or metoprolol. No dizziness, near syncope or syncope. His mother passed away last week.   Primary Care Physician: Santa Genera  Last Lipid Profile:Lipid Panel     Component Value Date/Time   CHOL 196 04/10/2012 1657   TRIG 96.0 04/10/2012 1657   HDL 52.90 04/10/2012 1657   CHOLHDL 4 04/10/2012 1657   VLDL 19.2 04/10/2012 1657   LDLCALC 124* 04/10/2012 1657     Past Medical History  Diagnosis Date  . Anemia   . hyperlipidemia   . Paroxysmal atrial fibrillation     Past Surgical History  Procedure Laterality Date  . Tonsillectomy    . Penile prosthesis implant    . Hernia repair      Current Outpatient Prescriptions  Medication Sig Dispense Refill  . aspirin 81 MG chewable tablet Chew 81 mg by mouth daily.      Marland Kitchen esomeprazole (NEXIUM) 40 MG capsule Take 1 capsule (40 mg total) by mouth daily.  30 capsule  11  . flecainide (TAMBOCOR) 100 MG tablet Take one tablet by mouth two times daily as needed for palpitations  10 tablet  6  . metoprolol (LOPRESSOR) 50 MG tablet Take 1 tablet (50 mg total) by mouth 2 (two) times daily.  60 tablet  11  . rosuvastatin (CRESTOR) 5 MG tablet Take 1 tablet (5 mg total) by mouth daily.  28 tablet  0   No current  facility-administered medications for this visit.    No Known Allergies  History   Social History  . Marital Status: Divorced    Spouse Name: N/A    Number of Children: 0  . Years of Education: N/A   Occupational History  . ministor    Social History Main Topics  . Smoking status: Current Some Day Smoker    Types: Cigars  . Smokeless tobacco: Never Used  . Alcohol Use: No  . Drug Use: No  . Sexually Active: Not Currently   Other Topics Concern  . Not on file   Social History Narrative  . No narrative on file    Family History  Problem Relation Age of Onset  . Diabetes Mother   . Stroke Mother   . Heart disease Mother   . Diabetes Father   . Diabetes Sister   . Cancer Father     Prostate cancer    Review of Systems:  As stated in the HPI and otherwise negative.   BP 134/90  Pulse 72  Ht 6' (1.829 m)  Wt 227 lb (102.967 kg)  BMI 30.78 kg/m2  Physical Examination: General: Well developed, well nourished, NAD HEENT: OP clear, mucus membranes moist SKIN: warm, dry. No rashes. Neuro: No focal  deficits Musculoskeletal: Muscle strength 5/5 all ext Psychiatric: Mood and affect normal Neck: No JVD, no carotid bruits, no thyromegaly, no lymphadenopathy. Lungs:Clear bilaterally, no wheezes, rhonci, crackles Cardiovascular: Regular rate and rhythm. No murmurs, gallops or rubs. Abdomen:Soft. Bowel sounds present. Non-tender.  Extremities: No lower extremity edema. Pulses are 2 + in the bilateral DP/PT.  EKG: NSR, rate 72 bpm.   Cardiac cath 03/23/08: Dr. Donnie Aho  ANGIOGRAPHIC DATA: Left ventriculogram: Performed in the 30 degree RAO  projection. The aortic valve is normal. The mitral valve is normal.  The left ventricle appears normal in size. Estimated ejection fraction  is 55%. Coronary arteries arise and distribute normally. There is no  significant coronary artery calcification noted. The left main coronary  artery is normal. Left anterior descending is a  large vessel and  contains no significant obstructive disease. The circumflex coronary  artery has a large intermediate branch that is free of disease. The  circumflex has two coronary arteries. He has two marginal arteries.  The right coronary artery is a dominant vessel that contains no  significant disease.   Echo 07/10/10:  Left ventricle: The cavity size was normal. Systolic function was normal. The estimated ejection fraction was in the range of 55% to 60%. Wall motion was normal; there were no regional wall motion abnormalities.   Assessment and Plan:   1. Paroxysmal atrial fibrillation: Sinus today. Continue Metoprolol tartrate 50 mg po BID and will use "pill in pocket" approach with Flecainide 100 mg po Q12 hours as needed for irregularity of his heart rhythm. His CHADs2 score is 0. Will continue ASA 81 mg po Qdaily.

## 2013-06-08 NOTE — Telephone Encounter (Signed)
Tried to contact pt that we are still out

## 2013-06-09 NOTE — Telephone Encounter (Signed)
L/m for pt samples out front

## 2013-07-09 ENCOUNTER — Telehealth: Payer: Self-pay | Admitting: Gastroenterology

## 2013-07-13 NOTE — Telephone Encounter (Signed)
Patient picked up his samples today

## 2013-07-23 ENCOUNTER — Ambulatory Visit: Payer: Self-pay | Admitting: Cardiovascular Disease

## 2013-08-03 ENCOUNTER — Telehealth: Payer: Self-pay | Admitting: Gastroenterology

## 2013-08-03 NOTE — Telephone Encounter (Signed)
Picked up samples today.

## 2013-08-11 ENCOUNTER — Other Ambulatory Visit: Payer: Self-pay | Admitting: Cardiovascular Disease

## 2013-08-26 ENCOUNTER — Telehealth: Payer: Self-pay | Admitting: Gastroenterology

## 2013-08-26 NOTE — Telephone Encounter (Signed)
NO SAMPLES OF NEXIUM L/M FOR PT

## 2013-08-31 ENCOUNTER — Telehealth: Payer: Self-pay

## 2013-08-31 DIAGNOSIS — E7800 Pure hypercholesterolemia, unspecified: Secondary | ICD-10-CM

## 2013-08-31 MED ORDER — ROSUVASTATIN CALCIUM 5 MG PO TABS: 5.0000 mg | ORAL_TABLET | Freq: Every day | ORAL | Status: DC

## 2013-08-31 NOTE — Telephone Encounter (Signed)
Samples given and documented

## 2013-09-15 ENCOUNTER — Telehealth: Payer: Self-pay | Admitting: Cardiovascular Disease

## 2013-09-15 NOTE — Telephone Encounter (Signed)
Rec'd from Disability Determination Services forward 3 pages to [facility]

## 2013-09-22 ENCOUNTER — Telehealth: Payer: Self-pay | Admitting: Gastroenterology

## 2013-09-23 NOTE — Telephone Encounter (Signed)
Samples out front for pt. Tried to call no answer

## 2013-10-01 ENCOUNTER — Telehealth: Payer: Self-pay | Admitting: Gastroenterology

## 2013-10-01 NOTE — Telephone Encounter (Signed)
Pt states he does not have insurance and cannot come for his OV. Request appt be cancelled. OV cancelled.

## 2013-10-07 ENCOUNTER — Telehealth: Payer: Self-pay

## 2013-10-07 NOTE — Telephone Encounter (Signed)
Patient came in to get samples of crestor and asp

## 2013-10-16 ENCOUNTER — Telehealth: Payer: Self-pay | Admitting: Gastroenterology

## 2013-10-19 NOTE — Telephone Encounter (Signed)
Pt picked up samples today.

## 2013-11-02 ENCOUNTER — Ambulatory Visit: Payer: Self-pay | Admitting: Gastroenterology

## 2013-11-20 ENCOUNTER — Other Ambulatory Visit: Payer: Self-pay

## 2013-11-20 MED ORDER — ESOMEPRAZOLE MAGNESIUM 40 MG PO CPDR: 40.0000 mg | DELAYED_RELEASE_CAPSULE | Freq: Every day | ORAL | Status: DC

## 2013-11-20 NOTE — Telephone Encounter (Signed)
Patient walked in and requested samples.  Given a months worth of Nexium.  Ok per Merri Rayobin Stallings, CMA.

## 2013-12-17 ENCOUNTER — Telehealth: Payer: Self-pay | Admitting: Gastroenterology

## 2013-12-17 NOTE — Telephone Encounter (Signed)
Gave pt Nexium samples today

## 2013-12-31 ENCOUNTER — Telehealth: Payer: Self-pay | Admitting: *Deleted

## 2013-12-31 NOTE — Telephone Encounter (Signed)
Patient came into office requesting crestor samples. Samples provided. 

## 2014-01-18 ENCOUNTER — Telehealth: Payer: Self-pay | Admitting: Gastroenterology

## 2014-01-18 NOTE — Telephone Encounter (Signed)
Gave patient Nexium samples today

## 2014-02-04 ENCOUNTER — Other Ambulatory Visit: Payer: Self-pay | Admitting: Cardiovascular Disease

## 2014-02-23 ENCOUNTER — Telehealth: Payer: Self-pay | Admitting: Gastroenterology

## 2014-02-23 ENCOUNTER — Telehealth: Payer: Self-pay | Admitting: *Deleted

## 2014-02-23 NOTE — Telephone Encounter (Signed)
Patient came into office requesting crestor samples. Samples provided.

## 2014-02-23 NOTE — Telephone Encounter (Signed)
Patient picked up Nexium samples

## 2014-03-24 ENCOUNTER — Telehealth: Payer: Self-pay | Admitting: Gastroenterology

## 2014-03-25 NOTE — Telephone Encounter (Signed)
Patient came and picked up samples

## 2014-04-08 ENCOUNTER — Telehealth: Payer: Self-pay | Admitting: Gastroenterology

## 2014-04-12 NOTE — Telephone Encounter (Signed)
Patient came and picked up samples today

## 2014-05-03 ENCOUNTER — Telehealth: Payer: Self-pay | Admitting: Gastroenterology

## 2014-05-05 NOTE — Telephone Encounter (Signed)
Patient walked in office for Nexium samples  We are out

## 2014-05-14 ENCOUNTER — Telehealth: Payer: Self-pay | Admitting: Gastroenterology

## 2014-05-14 NOTE — Telephone Encounter (Signed)
Patient wanted samples of Nexium  Left message that we no longer have samples of Nexium and that I would be glad to send in a script of Nexium fo him

## 2014-05-19 ENCOUNTER — Telehealth: Payer: Self-pay | Admitting: Internal Medicine

## 2014-05-19 ENCOUNTER — Other Ambulatory Visit: Payer: Self-pay | Admitting: Gastroenterology

## 2014-05-19 ENCOUNTER — Telehealth: Payer: Self-pay

## 2014-05-19 ENCOUNTER — Telehealth: Payer: Self-pay | Admitting: Gastroenterology

## 2014-05-19 NOTE — Telephone Encounter (Signed)
Roy Hazelhristopher D McAlhany, MD at 06/04/2013 2:38 PM  rosuvastatin (CRESTOR) 5 MG tablet  Take 1 tablet (5 mg total) by mouth daily.   28 tablet   0  Pt called requesting samples of crestor. Samples left up front for pt pick up.

## 2014-05-19 NOTE — Telephone Encounter (Signed)
Patient called requesting samples or coupons for crestor. CB # [phone]

## 2014-05-19 NOTE — Telephone Encounter (Signed)
Already sent in through electronic refill request  Informed pt

## 2014-05-19 NOTE — Telephone Encounter (Signed)
LMOVM advising coupon mailed

## 2014-06-08 ENCOUNTER — Encounter: Payer: Self-pay | Admitting: Cardiovascular Disease

## 2014-06-08 NOTE — Progress Notes (Signed)
No show

## 2014-07-01 ENCOUNTER — Other Ambulatory Visit: Payer: Self-pay | Admitting: Cardiovascular Disease

## 2014-08-04 ENCOUNTER — Other Ambulatory Visit: Payer: Self-pay | Admitting: Cardiovascular Disease

## 2014-08-19 ENCOUNTER — Encounter: Payer: Self-pay | Admitting: Cardiovascular Disease

## 2014-08-19 NOTE — Progress Notes (Signed)
No show

## 2014-09-10 ENCOUNTER — Encounter: Payer: Self-pay | Admitting: Cardiovascular Disease

## 2014-11-09 ENCOUNTER — Ambulatory Visit (INDEPENDENT_AMBULATORY_CARE_PROVIDER_SITE_OTHER): Payer: Self-pay | Admitting: Cardiovascular Disease

## 2014-11-09 ENCOUNTER — Encounter: Payer: Self-pay | Admitting: Cardiovascular Disease

## 2014-11-09 VITALS — BP 130/88 | HR 70 | Ht 72.0 in | Wt 246.2 lb

## 2014-11-09 DIAGNOSIS — E7800 Pure hypercholesterolemia, unspecified: Secondary | ICD-10-CM

## 2014-11-09 DIAGNOSIS — I480 Paroxysmal atrial fibrillation: Secondary | ICD-10-CM

## 2014-11-09 MED ORDER — ROSUVASTATIN CALCIUM 5 MG PO TABS: 5.0000 mg | ORAL_TABLET | Freq: Every day | ORAL | Status: DC

## 2014-11-09 NOTE — Patient Instructions (Signed)
Your physician wants you to follow-up in:  12 months.  You will receive a reminder letter in the mail two months in advance. If you don't receive a letter, please call our office to schedule the follow-up appointment.  Please complete paperwork for Crestor assistance program and return to our office.

## 2014-11-09 NOTE — Progress Notes (Signed)
History of Present Illness: 54 yo AAM with history of paroxysmal atrial fibrillation, hyperlipidemia who is here today for cardiac follow up. I saw him as a new patient 07/23/12. He had been followed in the past by Dr. Viann FishSpencer Tilley. He has been on Flecainide for many years. Cardiac cath 03/23/2008 with normal coronary arteries. Echo 07/10/10 with normal LV size and function. No significant valvular issues. He was seen in the ED 04/30/12 with c/o palpitations. He had been out of Flecainide due to financial issues. He was found to be in sinus rhythm during the ED visit.  He is here today for follow up. He has been feeling well. No chest pain or SOB. No awareness of palpitations. No dizziness, near syncope or syncope. He has been taking ASA and metoprolol. He has been out of Crestor. He cannot afford it.   Primary Care Physician: Sanda Lingerhomas Jones  Last Lipid Profile:Lipid Panel     Component Value Date/Time   CHOL 196 04/10/2012 1657   TRIG 96.0 04/10/2012 1657   HDL 52.90 04/10/2012 1657   CHOLHDL 4 04/10/2012 1657   VLDL 19.2 04/10/2012 1657   LDLCALC 124* 04/10/2012 1657    Past Medical History  Diagnosis Date  . Anemia   . hyperlipidemia   . Paroxysmal atrial fibrillation     Past Surgical History  Procedure Laterality Date  . Tonsillectomy    . Penile prosthesis implant    . Hernia repair      Current Outpatient Prescriptions  Medication Sig Dispense Refill  . aspirin 81 MG chewable tablet Chew 81 mg by mouth daily.    Marland Kitchen. esomeprazole (NEXIUM) 40 MG capsule TAKE ONE CAPSULE BY MOUTH ONCE DAILY 30 capsule 0  . metoprolol (LOPRESSOR) 50 MG tablet TAKE ONE TABLET BY MOUTH TWICE DAILY 60 tablet 11  . Multiple Vitamins-Minerals (ONE-A-DAY 50 PLUS PO) Take by mouth. Daily by mouth    . rosuvastatin (CRESTOR) 5 MG tablet Take 1 tablet (5 mg total) by mouth daily. 48 tablet 0   No current facility-administered medications for this visit.    No Known Allergies  History   Social  History  . Marital Status: Divorced    Spouse Name: N/A    Number of Children: 0  . Years of Education: N/A   Occupational History  . ministor    Social History Main Topics  . Smoking status: Current Some Day Smoker    Types: Cigars  . Smokeless tobacco: Never Used  . Alcohol Use: No  . Drug Use: No  . Sexual Activity: Not Currently   Other Topics Concern  . Not on file   Social History Narrative    Family History  Problem Relation Age of Onset  . Diabetes Mother   . Stroke Mother   . Heart disease Mother   . Diabetes Father   . Diabetes Sister   . Cancer Father     Prostate cancer    Review of Systems:  As stated in the HPI and otherwise negative.   BP 130/88 mmHg  Pulse 70  Ht 6' (1.829 m)  Wt 246 lb 3.2 oz (111.676 kg)  BMI 33.38 kg/m2  SpO2 98%  Physical Examination: General: Well developed, well nourished, NAD HEENT: OP clear, mucus membranes moist SKIN: warm, dry. No rashes. Neuro: No focal deficits Musculoskeletal: Muscle strength 5/5 all ext Psychiatric: Mood and affect normal Neck: No JVD, no carotid bruits, no thyromegaly, no lymphadenopathy. Lungs:Clear bilaterally, no wheezes, rhonci, crackles Cardiovascular:  Regular rate and rhythm. No murmurs, gallops or rubs. Abdomen:Soft. Bowel sounds present. Non-tender.  Extremities: No lower extremity edema. Pulses are 2 + in the bilateral DP/PT.  EKG: NSR, rate 70 bpm. Chronic ST segment elevation leads 1/avL  Cardiac cath 03/23/08: Dr. Donnie Aho  ANGIOGRAPHIC DATA: Left ventriculogram: Performed in the 30 degree RAO  projection. The aortic valve is normal. The mitral valve is normal.  The left ventricle appears normal in size. Estimated ejection fraction  is 55%. Coronary arteries arise and distribute normally. There is no  significant coronary artery calcification noted. The left main coronary  artery is normal. Left anterior descending is a large vessel and  contains no significant obstructive  disease. The circumflex coronary  artery has a large intermediate branch that is free of disease. The  circumflex has two coronary arteries. He has two marginal arteries.  The right coronary artery is a dominant vessel that contains no  significant disease.   Echo 07/10/10:  Left ventricle: The cavity size was normal. Systolic function was normal. The estimated ejection fraction was in the range of 55% to 60%. Wall motion was normal; there were no regional wall motion abnormalities.   Assessment and Plan:   1. Paroxysmal atrial fibrillation: Sinus today. Continue Metoprolol tartrate 50 mg po BID. His CHADs2 score is 0. Will continue ASA 81 mg po Qdaily.   2. Hyperlipidemia: He was started on Crestor in primary care in 2013 but has not followed up there since. He has been mostly out of Crestor except when he can get samples. We will give him paperwork for the assistance program. He should qualify. He is asked to follow up in primary care over next 1-2 months for further management, repeat lipids and LFTs.

## 2014-11-30 ENCOUNTER — Telehealth: Payer: Self-pay | Admitting: Internal Medicine

## 2014-11-30 NOTE — Telephone Encounter (Signed)
Pt was call to see if we have any sample for rosuvastatin (CRESTOR). Please call him

## 2015-07-06 ENCOUNTER — Telehealth: Payer: Self-pay | Admitting: Cardiovascular Disease

## 2015-07-06 ENCOUNTER — Telehealth: Payer: Self-pay | Admitting: *Deleted

## 2015-07-06 NOTE — Telephone Encounter (Signed)
Received walk in form from medical records. Pt came into office today requesting letter of support for lawyer. Message also states to call if questions.  I placed call to pt to get more information and left message to call back

## 2015-07-06 NOTE — Telephone Encounter (Signed)
Walk in pt form- pt needs letter for lawyer-gave to [facility]

## 2015-07-06 NOTE — Telephone Encounter (Signed)
I could only write a letter stating that he has a history of atrial fibrillation with no recent recurrences. I do not think this will assist in his claim for disability. He has no disabling cardiac conditions. I would advise him to contact his primary care physician to discuss further. Thayer Ohm

## 2015-07-06 NOTE — Telephone Encounter (Signed)
Spoke with pt. He is filing for disability. His attorney told him to contact cardiology for letter of support.  Pt reports he is not currently working and has not worked in [employer].  I told pt I would send message to Dr. Clifton James to determine if he feels pt is permanently disabled.  Pt would like letter mailed to him if Dr. Clifton James is willing to write.

## 2015-07-07 NOTE — Telephone Encounter (Signed)
Spoke with pt and gave him information from Dr. Clifton James. Pt states he would like Dr. Clifton James to write letter outlining information below.  He would like this mailed to him.  Address in chart confirmed with pt.

## 2015-07-12 ENCOUNTER — Encounter: Payer: Self-pay | Admitting: Cardiovascular Disease

## 2015-07-12 NOTE — Telephone Encounter (Signed)
Letter has been written. cdm 

## 2015-07-13 NOTE — Telephone Encounter (Signed)
Letter mailed to pt.  

## 2015-07-30 ENCOUNTER — Other Ambulatory Visit: Payer: Self-pay | Admitting: Cardiology

## 2015-11-07 ENCOUNTER — Other Ambulatory Visit: Payer: Self-pay | Admitting: Cardiology

## 2015-11-11 ENCOUNTER — Telehealth: Payer: Self-pay | Admitting: Internal Medicine

## 2015-11-11 NOTE — Telephone Encounter (Signed)
Has not been seen since 2015 needs an apt to be eval for this referral request

## 2015-11-11 NOTE — Telephone Encounter (Signed)
Pt actually hasn't been seen since July of 2013. I called pt to re establish but he has Medicaid. Referred him elsewhere

## 2015-11-11 NOTE — Telephone Encounter (Signed)
1.13.17 Pt is requesting referral to neurologist Dr. Gaynelle Arabianonald Davis (number is [phone]). Please call pt with questions and concerns. MS

## 2015-12-12 ENCOUNTER — Encounter: Payer: Self-pay | Admitting: Cardiovascular Disease

## 2015-12-12 ENCOUNTER — Ambulatory Visit (INDEPENDENT_AMBULATORY_CARE_PROVIDER_SITE_OTHER): Payer: Medicaid Other | Admitting: Cardiovascular Disease

## 2015-12-12 VITALS — BP 160/108 | HR 70 | Ht 72.0 in | Wt 235.8 lb

## 2015-12-12 DIAGNOSIS — E7800 Pure hypercholesterolemia, unspecified: Secondary | ICD-10-CM | POA: Diagnosis not present

## 2015-12-12 DIAGNOSIS — I10 Essential (primary) hypertension: Secondary | ICD-10-CM | POA: Diagnosis not present

## 2015-12-12 DIAGNOSIS — I480 Paroxysmal atrial fibrillation: Secondary | ICD-10-CM

## 2015-12-12 LAB — HEPATIC FUNCTION PANEL
ALT: 16 U/L (ref 9–46)
AST: 23 U/L (ref 10–35)
Albumin: 3.6 g/dL (ref 3.6–5.1)
Alkaline Phosphatase: 47 U/L (ref 40–115)
Bilirubin, Direct: 0.1 mg/dL (ref <=0.2)
Indirect Bilirubin: 0.4 mg/dL (ref 0.2–1.2)
Total Bilirubin: 0.5 mg/dL (ref 0.2–1.2)
Total Protein: 7.3 g/dL (ref 6.1–8.1)

## 2015-12-12 LAB — BASIC METABOLIC PANEL
BUN: 14 mg/dL (ref 7–25)
CO2: 28 mmol/L (ref 20–31)
Calcium: 9.2 mg/dL (ref 8.6–10.3)
Chloride: 106 mmol/L (ref 98–110)
Creat: 0.9 mg/dL (ref 0.70–1.33)
Glucose, Bld: 98 mg/dL (ref 65–99)
POTASSIUM: 3.9 mmol/L (ref 3.5–5.3)
Sodium: 141 mmol/L (ref 135–146)

## 2015-12-12 LAB — LIPID PANEL
CHOL/HDL RATIO: 3.2 ratio (ref <=5.0)
Cholesterol: 181 mg/dL (ref 125–200)
HDL: 56 mg/dL (ref >=40)
LDL CALC: 113 mg/dL (ref <130)
TRIGLYCERIDES: 59 mg/dL (ref <150)
VLDL: 12 mg/dL (ref <30)

## 2015-12-12 MED ORDER — AMLODIPINE BESYLATE 5 MG PO TABS: 5.0000 mg | ORAL_TABLET | Freq: Every day | ORAL | Status: DC

## 2015-12-12 NOTE — Patient Instructions (Signed)
Medication Instructions:  Your physician has recommended you make the following change in your medication:  Start Norvasc 5 mg by mouth daily.    Labwork: Lab work to be done today--BMP, Lipid and Liver profiles  Testing/Procedures: none  Follow-Up: Your physician recommends that you schedule a follow-up appointment in:2 weeks with pharmacist in our office (hypertension clinic) for blood pressure check.   Your physician wants you to follow-up in: 12 months.  You will receive a reminder letter in the mail two months in advance. If you don't receive a letter, please call our office to schedule the follow-up appointment.      Any Other Special Instructions Will Be Listed Below (If Applicable).     If you need a refill on your cardiac medications before your next appointment, please call your pharmacy.

## 2015-12-12 NOTE — Progress Notes (Signed)
Chief Complaint  Patient presents with  . Follow-up    PT has no complaints today  . Atrial Fibrillation   History of Present Illness: 55 yo AAM with history of paroxysmal atrial fibrillation, hyperlipidemia who is here today for cardiac follow up. I saw him as a new patient 07/23/12. He had been followed in the past by Dr. Viann Fish. He has been on Flecainide for many years. Cardiac cath 03/23/2008 with normal coronary arteries. Echo 07/10/10 with normal LV size and function. No significant valvular issues. He was seen in the ED 04/30/12 with c/o palpitations. He had been out of Flecainide due to financial issues. He was found to be in sinus rhythm during the ED visit.  He is here today for follow up. He has been feeling well. No chest pain or SOB. No awareness of palpitations. No dizziness, near syncope or syncope. He has been taking ASA and metoprolol.   Primary Care Physician: Sanda Linger   Past Medical History  Diagnosis Date  . Anemia   . hyperlipidemia   . Paroxysmal atrial fibrillation (HCC)   . HTN (hypertension)     Past Surgical History  Procedure Laterality Date  . Tonsillectomy    . Penile prosthesis implant    . Hernia repair      Current Outpatient Prescriptions  Medication Sig Dispense Refill  . aspirin 81 MG chewable tablet Chew 81 mg by mouth daily.    Marland Kitchen esomeprazole (NEXIUM) 40 MG capsule TAKE ONE CAPSULE BY MOUTH ONCE DAILY 30 capsule 0  . metoprolol (LOPRESSOR) 50 MG tablet TAKE ONE TABLET BY MOUTH TWICE DAILY 60 tablet 1  . Multiple Vitamins-Minerals (ONE-A-DAY 50 PLUS PO) Take by mouth. Daily by mouth    . rosuvastatin (CRESTOR) 5 MG tablet Take 1 tablet (5 mg total) by mouth daily. 30 tablet 11  . amLODipine (NORVASC) 5 MG tablet Take 1 tablet (5 mg total) by mouth daily. 30 tablet 11   No current facility-administered medications for this visit.    No Known Allergies  Social History   Social History  . Marital Status: Divorced    Spouse  Name: N/A  . Number of Children: 0  . Years of Education: N/A   Occupational History  . ministor    Social History Main Topics  . Smoking status: Current Some Day Smoker    Types: Cigars  . Smokeless tobacco: Never Used  . Alcohol Use: No  . Drug Use: No  . Sexual Activity: Not Currently   Other Topics Concern  . Not on file   Social History Narrative    Family History  Problem Relation Age of Onset  . Diabetes Mother   . Stroke Mother   . Heart disease Mother   . Diabetes Father   . Diabetes Sister   . Cancer Father     Prostate cancer    Review of Systems:  As stated in the HPI and otherwise negative.   BP 160/108 mmHg  Pulse 70  Ht 6' (1.829 m)  Wt 235 lb 12.8 oz (106.958 kg)  BMI 31.97 kg/m2  Physical Examination: General: Well developed, well nourished, NAD HEENT: OP clear, mucus membranes moist SKIN: warm, dry. No rashes. Neuro: No focal deficits Musculoskeletal: Muscle strength 5/5 all ext Psychiatric: Mood and affect normal Neck: No JVD, no carotid bruits, no thyromegaly, no lymphadenopathy. Lungs:Clear bilaterally, no wheezes, rhonci, crackles Cardiovascular: Regular rate and rhythm. No murmurs, gallops or rubs. Abdomen:Soft. Bowel sounds present. Non-tender.  Extremities: No lower extremity edema. Pulses are 2 + in the bilateral DP/PT.  Cardiac cath 03/23/08: Dr. Donnie Aho  ANGIOGRAPHIC DATA: Left ventriculogram: Performed in the 30 degree RAO  projection. The aortic valve is normal. The mitral valve is normal.  The left ventricle appears normal in size. Estimated ejection fraction  is 55%. Coronary arteries arise and distribute normally. There is no  significant coronary artery calcification noted. The left main coronary  artery is normal. Left anterior descending is a large vessel and  contains no significant obstructive disease. The circumflex coronary  artery has a large intermediate branch that is free of disease. The  circumflex has two  coronary arteries. He has two marginal arteries.  The right coronary artery is a dominant vessel that contains no  significant disease.   Echo 07/10/10:  Left ventricle: The cavity size was normal. Systolic function was normal. The estimated ejection fraction was in the range of 55% to 60%. Wall motion was normal; there were no regional wall motion abnormalities.   EKG:  EKG is ordered today. The ekg ordered today demonstrates NSR, rate 71 bpm.   Recent Labs: No results found for requested labs within last 365 days.   Lipid Panel    Component Value Date/Time   CHOL 196 04/10/2012 1657   TRIG 96.0 04/10/2012 1657   HDL 52.90 04/10/2012 1657   CHOLHDL 4 04/10/2012 1657   VLDL 19.2 04/10/2012 1657   LDLCALC 124* 04/10/2012 1657     Wt Readings from Last 3 Encounters:  12/12/15 235 lb 12.8 oz (106.958 kg)  11/09/14 246 lb 3.2 oz (111.676 kg)  06/04/13 227 lb (102.967 kg)     Other studies Reviewed: Additional studies/ records that were reviewed today include: . Review of the above records demonstrates:    Assessment and Plan:   1. Paroxysmal atrial fibrillation: Sinus today. He has had no palpitations. Continue Metoprolol tartrate 50 mg po BID. His CHADs2 score is 0. Will continue ASA 81 mg po Qdaily.   2. Hyperlipidemia: He is on Crestor. He needs lipids and LFTs updated today  3. HTN: BP is elevated today. Will start Norvasc 5 mg daily. Check BMET today. Follow up 2 weeks with PharmD for BP check.   Current medicines are reviewed at length with the patient today.  The patient does not have concerns regarding medicines.  The following changes have been made:  no change  Labs/ tests ordered today include:   Orders Placed This Encounter  Procedures  . Basic Metabolic Panel (BMET)  . Lipid Profile  . Hepatic function panel  . EKG 12-Lead     Disposition:   FU with me in 12  months   Signed, Verne Carrow, MD 12/12/2015 9:11 AM    [facility] [address] [place], [place], [place]  [address] Phone: [phone]; Fax: [phone]

## 2015-12-14 ENCOUNTER — Telehealth: Payer: Self-pay | Admitting: Cardiovascular Disease

## 2015-12-14 ENCOUNTER — Other Ambulatory Visit: Payer: Self-pay | Admitting: *Deleted

## 2015-12-14 DIAGNOSIS — E7800 Pure hypercholesterolemia, unspecified: Secondary | ICD-10-CM

## 2015-12-14 MED ORDER — PRAVASTATIN SODIUM 40 MG PO TABS: 40.0000 mg | ORAL_TABLET | Freq: Every evening | ORAL | Status: DC

## 2015-12-14 MED ORDER — ROSUVASTATIN CALCIUM 5 MG PO TABS: 5.0000 mg | ORAL_TABLET | Freq: Every day | ORAL | Status: DC

## 2015-12-14 NOTE — Telephone Encounter (Signed)
New Message  Pt returning RN pohne call. Please call back and discuss.

## 2015-12-14 NOTE — Telephone Encounter (Signed)
We can change to Pravastatin 40 mg daily. cdm

## 2015-12-14 NOTE — Telephone Encounter (Signed)
Spoke with pt. He has not been taking Crestor due to cost (was previously in assistance program). Prescription was sent today to pharmacy for Rosuvastatin but he cannot afford.  Has tried Lipitor in past but he thinks this was stopped due to muscle aches. Will forward to Dr. Clifton James for recommendations.

## 2015-12-14 NOTE — Telephone Encounter (Signed)
Pt notified. Will send to [facility]. Pt will come in for fasting lab work on May 10,2017 as planned.

## 2015-12-26 ENCOUNTER — Ambulatory Visit (INDEPENDENT_AMBULATORY_CARE_PROVIDER_SITE_OTHER): Payer: Medicaid Other | Admitting: Pharmacist

## 2015-12-26 VITALS — BP 148/88 | HR 71

## 2015-12-26 DIAGNOSIS — I10 Essential (primary) hypertension: Secondary | ICD-10-CM

## 2015-12-26 MED ORDER — AMLODIPINE BESYLATE 10 MG PO TABS: 10.0000 mg | ORAL_TABLET | Freq: Every day | ORAL | Status: DC

## 2015-12-26 NOTE — Patient Instructions (Signed)
Start taking 2 of your amlodipine  tablets per day A prescription for a higher strength of your amlodipine has been sent to your pharmacy to start taking 1 tablet of amlodipine  a day - start taking this once you run out of your  tablets We will recheck your blood pressure in clinic in 1 month

## 2015-12-26 NOTE — Progress Notes (Signed)
Patient ID: [name]                 DOB: 1961-03-28                         MRN: [id number]     HPI: [name] is a 55 y.o. male referred by Dr. Clifton James to HTN clinic. Patient has a PMH of paroxysmal atrial fibrillation, hyperlipidemia, and hypertension. Patient was started on amlodipine 5 mg at office visit 2 weeks ago. Patient denies dizziness and swelling. He states he does not have a blood pressure cuff at home.  BP in office today: 148/88 HR: 71  Current HTN meds: amlodipine 5 mg, metoprolol 50 mg BID Previously tried: none  BP goal: <140/35mmHg  Family History: Hypertension and heart disease in father; stroke and hypertension in mother   Social History: Patient reports he uses cigars occasionally about once a month. Denies alcohol or illicit drug use.  Diet: Breakfast - muffins, coffee, lunch - [place] breast, hot dog, dinner - beef, baked fish, vegetables, mainly cooks at home.  Exercise: Walks for 30 minutes every other day.   Wt Readings from Last 3 Encounters:  12/12/15 235 lb 12.8 oz (106.958 kg)  11/09/14 246 lb 3.2 oz (111.676 kg)  06/04/13 227 lb (102.967 kg)   BP Readings from Last 3 Encounters:  12/12/15 160/108  11/09/14 130/88  06/04/13 134/90   Pulse Readings from Last 3 Encounters:  12/12/15 70  11/09/14 70  06/04/13 72    Renal function: CrCl cannot be calculated (Unknown ideal weight.).  Past Medical History  Diagnosis Date  . Anemia   . hyperlipidemia   . Paroxysmal atrial fibrillation (HCC)   . HTN (hypertension)     Current Outpatient Prescriptions on File Prior to Visit  Medication Sig Dispense Refill  . amLODipine (NORVASC) 5 MG tablet Take 1 tablet (5 mg total) by mouth daily. 30 tablet 11  . aspirin 81 MG chewable tablet Chew 81 mg by mouth daily.    Marland Kitchen esomeprazole (NEXIUM) 40 MG capsule TAKE ONE CAPSULE BY MOUTH ONCE DAILY 30 capsule 0  . metoprolol (LOPRESSOR) 50 MG tablet TAKE ONE TABLET BY MOUTH TWICE DAILY 60 tablet 1    . Multiple Vitamins-Minerals (ONE-A-DAY 50 PLUS PO) Take by mouth. Daily by mouth    . pravastatin (PRAVACHOL) 40 MG tablet Take 1 tablet (40 mg total) by mouth every evening. 30 tablet 11   No current facility-administered medications on file prior to visit.    No Known Allergies   Assessment/Plan:  1. Hypertension - Patients BP 148/88 still above goal <140/90. Patient is currently taking amlodipine 5 mg. Will increase to amlodipine 10 mg daily. Advised patient to buy a BP cuff and bring in a BP log to next visit. Will follow up in 1 month for BP check. If BP still high then, would switch from metoprolol to carvedilol for better BP control.   Megan E. Supple, PharmD, CPP [facility] [address], [place], [place] [address] Phone: [phone]; Fax: [phone] 12/26/2015 2:45 PM

## 2016-01-04 ENCOUNTER — Other Ambulatory Visit: Payer: Self-pay | Admitting: Cardiology

## 2016-01-24 ENCOUNTER — Ambulatory Visit (INDEPENDENT_AMBULATORY_CARE_PROVIDER_SITE_OTHER): Payer: Medicaid Other | Admitting: Pharmacist

## 2016-01-24 VITALS — BP 118/82 | HR 73

## 2016-01-24 DIAGNOSIS — I10 Essential (primary) hypertension: Secondary | ICD-10-CM

## 2016-01-24 NOTE — Progress Notes (Signed)
Patient ID: Roy NearingWayne M Giovanelli                 DOB: 28-Jun-1961, 55 yo                         MRN: [id number]     HPI: [name] is a 55 y.o. male referred by Dr. Clifton JamesMcAlhany to HTN clinic who presents today for follow up. Patient has a PMH of paroxysmal atrial fibrillation, hyperlipidemia, and hypertension. Recently, his amlodipine was increased to 10mg  daily.  [facility] and reports that readings were "good." He does not have a home BP cuff. Patient denies dizziness and swelling. He reports that he has cut back on sat and has been walking more.   Current HTN meds: amlodipine 10 mg daily, metoprolol 50 mg BID Previously tried: none  BP goal: <140/8990mmHg  Family History: Hypertension and heart disease in father; stroke and hypertension in mother   Social History: Patient reports he uses cigars occasionally about once a month. Denies alcohol or illicit drug use.  Diet: Breakfast - muffins, coffee, lunch - [place] breast, hot dog, dinner - beef, baked fish, vegetables, mainly cooks at home.  Exercise: Walks for 30 minutes every other day.  Wt Readings from Last 3 Encounters:  12/12/15 235 lb 12.8 oz (106.958 kg)  11/09/14 246 lb 3.2 oz (111.676 kg)  06/04/13 227 lb (102.967 kg)   BP Readings from Last 3 Encounters:  12/26/15 148/88  12/12/15 160/108  11/09/14 130/88   Pulse Readings from Last 3 Encounters:  12/26/15 71  12/12/15 70  11/09/14 70    Renal function: CrCl cannot be calculated (Unknown ideal weight.).  Past Medical History  Diagnosis Date  . Anemia   . hyperlipidemia   . Paroxysmal atrial fibrillation (HCC)   . HTN (hypertension)     Current Outpatient Prescriptions on File Prior to Visit  Medication Sig Dispense Refill  . amLODipine (NORVASC) 10 MG tablet Take 1 tablet (10 mg total) by mouth daily. 30 tablet 11  . aspirin 81 MG chewable tablet Chew 81 mg by mouth daily.    Marland Kitchen. esomeprazole (NEXIUM) 40 MG capsule TAKE ONE CAPSULE BY  MOUTH ONCE DAILY 30 capsule 0  . metoprolol (LOPRESSOR) 50 MG tablet TAKE ONE TABLET BY MOUTH TWICE DAILY 60 tablet 11  . Multiple Vitamins-Minerals (ONE-A-DAY 50 PLUS PO) Take by mouth. Daily by mouth    . pravastatin (PRAVACHOL) 40 MG tablet Take 1 tablet (40 mg total) by mouth every evening. 30 tablet 11   No current facility-administered medications on file prior to visit.    No Known Allergies   Assessment/Plan:  1. Hypertension - BP improved since increasing amlodipine, now at goal <140/10390mmHg in clinic and at drug store checks. Will continue current BP medications. Gave pt handout to monitor BP readings and advised to call if his systolic readings increase to > 160mmHg.   Megan E. Supple, PharmD, CPP [facility] [address], [place], [place] [address] Phone: [phone]) [phone]; Fax: [phone] 01/24/2016 8:36 AM

## 2016-03-07 ENCOUNTER — Other Ambulatory Visit: Payer: Medicaid Other

## 2016-03-09 ENCOUNTER — Other Ambulatory Visit (INDEPENDENT_AMBULATORY_CARE_PROVIDER_SITE_OTHER): Payer: Medicaid Other | Admitting: *Deleted

## 2016-03-09 DIAGNOSIS — E7800 Pure hypercholesterolemia, unspecified: Secondary | ICD-10-CM

## 2016-03-09 LAB — HEPATIC FUNCTION PANEL
ALT: 22 U/L (ref 9–46)
AST: 26 U/L (ref 10–35)
Albumin: 3.8 g/dL (ref 3.6–5.1)
Alkaline Phosphatase: 47 U/L (ref 40–115)
BILIRUBIN DIRECT: 0.1 mg/dL (ref <=0.2)
BILIRUBIN TOTAL: 0.5 mg/dL (ref 0.2–1.2)
Indirect Bilirubin: 0.4 mg/dL (ref 0.2–1.2)
Total Protein: 7 g/dL (ref 6.1–8.1)

## 2016-03-09 LAB — LIPID PANEL
CHOL/HDL RATIO: 2.4 ratio (ref <=5.0)
Cholesterol: 143 mg/dL (ref 125–200)
HDL: 60 mg/dL (ref >=40)
LDL CALC: 72 mg/dL (ref <130)
Triglycerides: 57 mg/dL (ref <150)
VLDL: 11 mg/dL (ref <30)

## 2016-11-30 ENCOUNTER — Other Ambulatory Visit: Payer: Self-pay | Admitting: Cardiovascular Disease

## 2016-12-03 ENCOUNTER — Other Ambulatory Visit: Payer: Self-pay | Admitting: Cardiovascular Disease

## 2016-12-14 ENCOUNTER — Encounter: Payer: Self-pay | Admitting: Cardiovascular Disease

## 2016-12-14 ENCOUNTER — Ambulatory Visit (INDEPENDENT_AMBULATORY_CARE_PROVIDER_SITE_OTHER): Payer: Medicaid Other | Admitting: Cardiovascular Disease

## 2016-12-14 VITALS — BP 100/70 | HR 75 | Ht 72.0 in | Wt 247.8 lb

## 2016-12-14 DIAGNOSIS — E7800 Pure hypercholesterolemia, unspecified: Secondary | ICD-10-CM

## 2016-12-14 DIAGNOSIS — I480 Paroxysmal atrial fibrillation: Secondary | ICD-10-CM

## 2016-12-14 DIAGNOSIS — I10 Essential (primary) hypertension: Secondary | ICD-10-CM

## 2016-12-14 NOTE — Progress Notes (Signed)
Chief Complaint  Patient presents with  . Follow-up   History of Present Illness: 56 yo AAM with history of paroxysmal atrial fibrillation, hyperlipidemia who is here today for cardiac follow up. I saw him as a new patient 07/23/12. He had been followed in the past by Dr. Viann Fish. He has been on Flecainide for many years. Cardiac cath 03/23/2008 with normal coronary arteries. Echo 07/10/10 with normal LV size and function. No significant valvular issues.  He is here today for follow up. He has been feeling well. No chest pain or SOB. No awareness of palpitations. No dizziness, near syncope or syncope. He has been taking ASA and metoprolol. He has been walking every day.   Primary Care Physician: No PCP Per Patient   Past Medical History:  Diagnosis Date  . Anemia   . HTN (hypertension)   . hyperlipidemia   . Paroxysmal atrial fibrillation [facility])     Past Surgical History:  Procedure Laterality Date  . HERNIA REPAIR    . PENILE PROSTHESIS IMPLANT    . TONSILLECTOMY      Current Outpatient Prescriptions  Medication Sig Dispense Refill  . amLODipine (NORVASC) 10 MG tablet Take 1 tablet (10 mg total) by mouth daily. 30 tablet 11  . aspirin 81 MG chewable tablet Chew 81 mg by mouth daily.    Marland Kitchen esomeprazole (NEXIUM) 40 MG capsule TAKE ONE CAPSULE BY MOUTH ONCE DAILY 30 capsule 0  . metoprolol (LOPRESSOR) 50 MG tablet TAKE ONE TABLET BY MOUTH TWICE DAILY 60 tablet 11  . Multiple Vitamins-Minerals (ONE-A-DAY 50 PLUS PO) Take by mouth. Daily by mouth    . pravastatin (PRAVACHOL) 40 MG tablet Take 1 tablet (40 mg total) by mouth every evening. *Please call and schedule a one year follow up appointment* 30 tablet 0   No current facility-administered medications for this visit.     No Known Allergies  Social History   Social History  . Marital status: Divorced    Spouse name: N/A  . Number of children: 0  . Years of education: N/A   Occupational History  . ministor [employer]   Social History Main Topics  . Smoking status: Current Some Day Smoker    Types: Cigars  . Smokeless tobacco: Never Used  . Alcohol use No  . Drug use: No  . Sexual activity: Not Currently   Other Topics Concern  . Not on file   Social History Narrative  . No narrative on file    Family History  Problem Relation Age of Onset  . Diabetes Mother   . Stroke Mother   . Heart disease Mother   . Diabetes Father   . Cancer Father     Prostate cancer  . Diabetes Sister     Review of Systems:  As stated in the HPI and otherwise negative.   BP 100/70   Pulse 75   Ht 6' (1.829 m)   Wt 247 lb 12.8 oz (112.4 kg)   BMI 33.61 kg/m   Physical Examination: General: Well developed, well nourished, NAD  HEENT: OP clear, mucus membranes moist  SKIN: warm, dry. No rashes. Neuro: No focal deficits  Musculoskeletal: Muscle strength 5/5 all ext  Psychiatric: Mood and affect normal  Neck: No JVD, no carotid bruits, no thyromegaly, no lymphadenopathy.  Lungs:Clear bilaterally, no wheezes, rhonci, crackles Cardiovascular: Regular rate and rhythm. No murmurs, gallops or rubs. Abdomen:Soft. Bowel sounds present. Non-tender.  Extremities: No lower extremity edema. Pulses  are 2 + in the bilateral DP/PT.  Cardiac cath 03/23/08: Dr. Donnie Ahoilley  ANGIOGRAPHIC DATA: Left ventriculogram: Performed in the 30 degree RAO  projection. The aortic valve is normal. The mitral valve is normal.  The left ventricle appears normal in size. Estimated ejection fraction  is 55%. Coronary arteries arise and distribute normally. There is no  significant coronary artery calcification noted. The left main coronary  artery is normal. Left anterior descending is a large vessel and  contains no significant obstructive disease. The circumflex coronary  artery has a large intermediate branch that is free of disease. The  circumflex has two coronary arteries. He has two marginal arteries.  The right coronary  artery is a dominant vessel that contains no  significant disease.   Echo 07/10/10:  Left ventricle: The cavity size was normal. Systolic function was normal. The estimated ejection fraction was in the range of 55% to 60%. Wall motion was normal; there were no regional wall motion abnormalities.   EKG:  EKG is ordered today. The ekg ordered today demonstrates NSR, rate 75 bpm  Recent Labs: 03/09/2016: ALT 22   Lipid Panel    Component Value Date/Time   CHOL 143 03/09/2016 0804   TRIG 57 03/09/2016 0804   HDL 60 03/09/2016 0804   CHOLHDL 2.4 03/09/2016 0804   VLDL 11 03/09/2016 0804   LDLCALC 72 03/09/2016 0804     Wt Readings from Last 3 Encounters:  12/14/16 247 lb 12.8 oz (112.4 kg)  12/12/15 235 lb 12.8 oz (107 kg)  11/09/14 246 lb 3.2 oz (111.7 kg)     Other studies Reviewed: Additional studies/ records that were reviewed today include: . Review of the above records demonstrates:    Assessment and Plan:   1. Paroxysmal atrial fibrillation: Sinus today. He has had no palpitations. Continue Metoprolol tartrate 50 mg po BID. His CHADS VASC score is 1. Will continue ASA 81 mg po Qdaily.   2. Hyperlipidemia: He is on Pravachol. Lipids well controlled. Will need lipids and LFTs checked in May 2018.   3. HTN: BP is controlled. Continue Norvasc and metoprolol  Current medicines are reviewed at length with the patient today.  The patient does not have concerns regarding medicines.  The following changes have been made:  no change  Labs/ tests ordered today include:   Orders Placed This Encounter  Procedures  . EKG 12-Lead     Disposition:   FU with me in 12  months   Signed, Verne Carrowhristopher Mi Balla, MD 12/14/2016 12:18 PM    [facility] [address], [place], [place]  [address] Phone: [phone](336) [phone]; Fax: [phone]) [phone]

## 2016-12-14 NOTE — Patient Instructions (Signed)
Medication Instructions:  Your physician recommends that you continue on your current medications as directed. Please refer to the Current Medication list given to you today.   Labwork: none  Testing/Procedures: none  Follow-Up: Your physician recommends that you schedule a follow-up appointment in: 12 months. Please call our office in about 10 months to schedule this appointment.     Any Other Special Instructions Will Be Listed Below (If Applicable).     If you need a refill on your cardiac medications before your next appointment, please call your pharmacy.   

## 2016-12-22 ENCOUNTER — Other Ambulatory Visit: Payer: Self-pay | Admitting: Cardiovascular Disease

## 2016-12-25 ENCOUNTER — Other Ambulatory Visit: Payer: Self-pay | Admitting: Cardiovascular Disease

## 2017-01-04 ENCOUNTER — Other Ambulatory Visit: Payer: Self-pay | Admitting: Cardiovascular Disease

## 2017-01-04 NOTE — Telephone Encounter (Signed)
Medication Detail    Disp Refills Start End   metoprolol (LOPRESSOR) 50 MG tablet 180 tablet 3 12/26/2016    Sig: TAKE ONE TABLET BY MOUTH TWICE DAILY   Notes to Pharmacy: Please consider 90 day supplies to promote better adherence   E-Prescribing Status: Receipt confirmed by pharmacy (12/26/2016 12:00 PM EST)   Pharmacy   [facility] - [place], [place] - [address]

## 2017-07-05 ENCOUNTER — Encounter (HOSPITAL_COMMUNITY): Payer: Self-pay | Admitting: *Deleted

## 2017-07-05 ENCOUNTER — Emergency Department (HOSPITAL_COMMUNITY)
Admission: EM | Admit: 2017-07-05 | Discharge: 2017-07-05 | Disposition: A | Discharge status: Home / Self Care | Payer: Medicaid Other | Attending: Emergency Medicine | Admitting: Emergency Medicine

## 2017-07-05 ENCOUNTER — Emergency Department (HOSPITAL_COMMUNITY): Payer: Medicaid Other

## 2017-07-05 DIAGNOSIS — F17290 Nicotine dependence, other tobacco product, uncomplicated: Secondary | ICD-10-CM | POA: Diagnosis not present

## 2017-07-05 DIAGNOSIS — N200 Calculus of kidney: Secondary | ICD-10-CM | POA: Diagnosis not present

## 2017-07-05 DIAGNOSIS — Z79899 Other long term (current) drug therapy: Secondary | ICD-10-CM | POA: Insufficient documentation

## 2017-07-05 DIAGNOSIS — Z7982 Long term (current) use of aspirin: Secondary | ICD-10-CM | POA: Diagnosis not present

## 2017-07-05 DIAGNOSIS — I10 Essential (primary) hypertension: Secondary | ICD-10-CM | POA: Insufficient documentation

## 2017-07-05 DIAGNOSIS — R1032 Left lower quadrant pain: Secondary | ICD-10-CM | POA: Diagnosis present

## 2017-07-05 LAB — URINALYSIS, ROUTINE W REFLEX MICROSCOPIC
Bilirubin Urine: NEGATIVE
Glucose, UA: NEGATIVE mg/dL
Hgb urine dipstick: NEGATIVE
Ketones, ur: 5 mg/dL — AB
Leukocytes, UA: NEGATIVE
Nitrite: NEGATIVE
Protein, ur: NEGATIVE mg/dL
Specific Gravity, Urine: 1.015 (ref 1.005–1.030)
pH: 5 (ref 5.0–8.0)

## 2017-07-05 LAB — CBC WITH DIFFERENTIAL/PLATELET
Basophils Absolute: 0 10*3/uL (ref 0.0–0.1)
Basophils Relative: 0 %
Eosinophils Absolute: 0 10*3/uL (ref 0.0–0.7)
Eosinophils Relative: 0 %
HCT: 36.2 % — ABNORMAL LOW (ref 39.0–52.0)
Hemoglobin: 11.8 g/dL — ABNORMAL LOW (ref 13.0–17.0)
Lymphocytes Relative: 14 %
Lymphs Abs: 1.6 10*3/uL (ref 0.7–4.0)
MCH: 27.8 pg (ref 26.0–34.0)
MCHC: 32.6 g/dL (ref 30.0–36.0)
MCV: 85.4 fL (ref 78.0–100.0)
Monocytes Absolute: 0.8 10*3/uL (ref 0.1–1.0)
Monocytes Relative: 7 %
Neutro Abs: 9.1 10*3/uL — ABNORMAL HIGH (ref 1.7–7.7)
Neutrophils Relative %: 79 %
Platelets: 238 10*3/uL (ref 150–400)
RBC: 4.24 MIL/uL (ref 4.22–5.81)
RDW: 15.2 % (ref 11.5–15.5)
WBC: 11.6 10*3/uL — ABNORMAL HIGH (ref 4.0–10.5)

## 2017-07-05 LAB — BASIC METABOLIC PANEL
Anion gap: 7 (ref 5–15)
BUN: 17 mg/dL (ref 6–20)
CO2: 24 mmol/L (ref 22–32)
Calcium: 9.1 mg/dL (ref 8.9–10.3)
Chloride: 109 mmol/L (ref 101–111)
Creatinine, Ser: 1.22 mg/dL (ref 0.61–1.24)
GFR calc Af Amer: >60 mL/min (ref >60)
GFR calc non Af Amer: >60 mL/min (ref >60)
Glucose, Bld: 118 mg/dL — ABNORMAL HIGH (ref 65–99)
Potassium: 4 mmol/L (ref 3.5–5.1)
Sodium: 140 mmol/L (ref 135–145)

## 2017-07-05 MED ORDER — KETOROLAC TROMETHAMINE 15 MG/ML IJ SOLN
15.0000 mg | Freq: Once | INTRAMUSCULAR | Status: AC
  Administered 2017-07-05: 15 mg via INTRAMUSCULAR
  Filled 2017-07-05: qty 1

## 2017-07-05 MED ORDER — ONDANSETRON HCL 4 MG PO TABS: 4.0000 mg | ORAL_TABLET | Freq: Four times a day (QID) | ORAL | 0 refills | Status: DC

## 2017-07-05 MED ORDER — HYDROMORPHONE HCL 1 MG/ML IJ SOLN
1.0000 mg | Freq: Once | INTRAMUSCULAR | Status: AC
  Administered 2017-07-05: 1 mg via INTRAMUSCULAR
  Filled 2017-07-05: qty 1

## 2017-07-05 MED ORDER — TRAMADOL HCL 50 MG PO TABS: 50.0000 mg | ORAL_TABLET | Freq: Four times a day (QID) | ORAL | 0 refills | Status: DC | PRN

## 2017-07-05 NOTE — ED Triage Notes (Signed)
Pt reports left side pain x 3 days. Denies urinary symptoms.

## 2017-07-05 NOTE — ED Provider Notes (Signed)
[facility]-EMERGENCY DEPT Provider Note   CSN: [id number] Arrival date & time: 07/05/17  0704     History   Chief Complaint Chief Complaint  Patient presents with  . Flank Pain    HPI [name] is a 56 y.o. male.  HPI   56 year old male with left flank pain. Gradual onset about 3 days ago. Persistent since then. It waxes and wanes without appreciable exacerbating or relieving factors. He feels like he may have pulled muscle although denies any trauma or strain. He does not feel like the pain is clearly worse with activity or certain positions last movements. Sometimes radiation to his left groin. No nausea. No urinary complaints. No fevers or chills. Eyes any past history of similar symptoms. Denies any past history of kidney stones.  Past Medical History:  Diagnosis Date  . Anemia   . HTN (hypertension)   . hyperlipidemia   . Paroxysmal atrial fibrillation [facility])     Patient Active Problem List   Diagnosis Date Noted  . Essential hypertension 12/26/2015  . PAF (paroxysmal atrial fibrillation) (HCC) 06/25/2012  . Other abnormal glucose 04/10/2012  . Pure hypercholesterolemia 04/10/2012  . ANEMIA-IRON DEFICIENCY 07/06/2010    Past Surgical History:  Procedure Laterality Date  . HERNIA REPAIR    . PENILE PROSTHESIS IMPLANT    . TONSILLECTOMY         Home Medications    Prior to Admission medications   Medication Sig Start Date End Date Taking? Authorizing Provider  amLODipine (NORVASC) 10 MG tablet TAKE ONE TABLET BY MOUTH ONCE DAILY 12/24/16   Kathleene Hazel, MD  aspirin 81 MG chewable tablet Chew 81 mg by mouth daily.    [provider]  esomeprazole (NEXIUM) 40 MG capsule TAKE ONE CAPSULE BY MOUTH ONCE DAILY 05/19/14   Louis Meckel, MD  metoprolol (LOPRESSOR) 50 MG tablet TAKE ONE TABLET BY MOUTH TWICE DAILY 12/26/16   Kathleene Hazel, MD  Multiple Vitamins-Minerals (ONE-A-DAY 50 PLUS PO) Take by mouth. Daily by mouth    [provider]  pravastatin (PRAVACHOL) 40 MG tablet Take one tablet by mouth every evening 01/04/17   Kathleene Hazel, MD    Family History Family History  Problem Relation Age of Onset  . Diabetes Mother   . Stroke Mother   . Heart disease Mother   . Diabetes Father   . Cancer Father        Prostate cancer  . Diabetes Sister     Social History Social History  Substance Use Topics  . Smoking status: Current Some Day Smoker    Types: Cigars  . Smokeless tobacco: Never Used  . Alcohol use No     Allergies   Patient has no known allergies.   Review of Systems Review of Systems  All systems reviewed and negative, other than as noted in HPI.  Physical Exam Updated Vital Signs BP (!) 144/82   Pulse 82   Temp 98.9 F (37.2 C) (Oral)   Resp 18   SpO2 97%   Physical Exam  Constitutional: He appears well-developed and well-nourished. No distress.  HENT:  Head: Normocephalic and atraumatic.  Eyes: Conjunctivae are normal. Right eye exhibits no discharge. Left eye exhibits no discharge.  Neck: Neck supple.  Cardiovascular: Normal rate, regular rhythm and normal heart sounds.  Exam reveals no gallop and no friction rub.   No murmur heard. Pulmonary/Chest: Effort normal and breath sounds normal. No respiratory distress.  Abdominal: Soft. He  exhibits no distension. There is no tenderness.  Genitourinary:  Genitourinary Comments: No cva tenderness. Penile implant  Musculoskeletal: He exhibits no edema or tenderness.  Neurological: He is alert.  Skin: Skin is warm and dry.  Psychiatric: He has a normal mood and affect. His behavior is normal. Thought content normal.  Nursing note and vitals reviewed.    ED Treatments / Results  Labs (all labs ordered are listed, but only abnormal results are displayed) Labs Reviewed  CBC WITH DIFFERENTIAL/PLATELET - Abnormal; Notable for the following:       Result Value   WBC 11.6 (*)    Hemoglobin 11.8 (*)    HCT 36.2  (*)    Neutro Abs 9.1 (*)    All other components within normal limits  BASIC METABOLIC PANEL - Abnormal; Notable for the following:    Glucose, Bld 118 (*)    All other components within normal limits  URINALYSIS, ROUTINE W REFLEX MICROSCOPIC - Abnormal; Notable for the following:    Ketones, ur 5 (*)    All other components within normal limits    EKG  EKG Interpretation None       Radiology No results found.   Ct Renal Stone Study  Result Date: 07/05/2017 CLINICAL DATA:  Two day history of left flank pain EXAM: CT ABDOMEN AND PELVIS WITHOUT CONTRAST TECHNIQUE: Multidetector CT imaging of the abdomen and pelvis was performed following the standard protocol without oral or intravenous contrast material administration. COMPARISON:  None. FINDINGS: Lower chest: There is patchy atelectatic change in the left base. There is mild scarring in the right middle lobe. There is a small hiatal hernia. Hepatobiliary: No focal liver lesions are appreciable on this noncontrast enhanced study. Gallbladder wall is not appreciably thickened. There is no biliary duct dilatation. Pancreas: There is no pancreatic mass or inflammatory focus. Spleen: No splenic lesions are evident. Adrenals/Urinary Tract: Adrenals appear unremarkable bilaterally. There is perinephric stranding and edema involving the left kidney. There is no renal mass on either side. There is moderate hydronephrosis on the left. There is no appreciable hydronephrosis on the right. There is a 5 x 4 mm nonobstructing calculus in the lower pole of the right kidney. There is no intrarenal calculus on the left. There is a calculus either at or immediately beyond the left ureterovesical junction measuring 6 x 4 mm. No other ureteral region calculi are evident on either side. Urinary bladder is midline. There is mild thickening of the urinary bladder wall. Stomach/Bowel: There is no appreciable bowel wall or mesenteric thickening. There is no evident  bowel obstruction. No free air or portal venous air. Vascular/Lymphatic: There is atherosclerotic calcification in the aorta and right common iliac artery. No aneurysm is evident. Major mesenteric vessels appear patent on this noncontrast enhanced study. There is no appreciable adenopathy in the abdomen or pelvis. Reproductive: There are scattered prostatic calculi. Prostate and seminal vesicles are normal in size and contour. No evident pelvic mass. There is a penile prosthesis with reservoir anterior to the bladder on the left in the lower pelvic region. The reservoir appears decompressed. Other: Appendix appears normal. No abscess or ascites is evident in the abdomen or pelvis. There is a rather minimal ventral hernia containing only fat. : Musculoskeletal: There is degenerative change in the lumbar spine. There is vacuum phenomenon with disc space narrowing at L5-S1. There are no blastic or lytic bone lesions. There is no intramuscular or abdominal wall lesion. IMPRESSION: 1. 6 x 4 mm calculus  either within or immediately distal to the left ureterovesical junction. There is moderate hydronephrosis in ureterectasis on the left with left renal edema. 2. Nonobstructing 5 x 4 mm calculus lower pole right kidney. No hydronephrosis on the right. 3. Penile prosthesis. Decompressed reservoir anterior to the urinary bladder on the left. 4. Urinary bladder wall thickening, felt to represent a degree of cystitis. 5.  Several prostatic calculi present. 6. No bowel wall thickening or bowel obstruction. No abscess. Appendix appears normal. 7. Small hiatal hernia. Rather minimal ventral hernia containing only fat. 8.  Aortoiliac atherosclerosis. Aortic Atherosclerosis (ICD10-I70.0). Electronically Signed   By: Bretta BangWilliam  Woodruff III M.D.   On: 07/05/2017 08:52   Procedures Procedures (including critical care time)  Medications Ordered in ED Medications  HYDROmorphone (DILAUDID) injection 1 mg (not administered)    ketorolac (TORADOL) 15 MG/ML injection 15 mg (not administered)     Initial Impression / Assessment and Plan / ED Course  I have reviewed the triage vital signs and the nursing notes.  Pertinent labs & imaging results that were available during my care of the patient were reviewed by me and considered in my medical decision making (see chart for details).     56 year old male with left flank pain. I suspect he may have a kidney stone. Pain is not reproducible with palpation. He reports some radiation to his left groin/testicle. During my exam he actually requested to sit up and then stand beside the bed. He is afebrile. Generally appears well. Will treat his pain. Will obtain a CT of the abdomen and pelvis. Basic labs and urinalysis.  Imaging as above. Symptomatically treatment. Afebrile. In medically stable. Urology follow-up for persistent symptoms. Emergent return precautions discussed.  Final Clinical Impressions(s) / ED Diagnoses   Final diagnoses:  Kidney stone    New Prescriptions New Prescriptions   No medications on file     Raeford RazorKohut, Jaysun Wessels, MD 07/23/17 1445

## 2017-11-14 ENCOUNTER — Encounter: Payer: Self-pay | Admitting: Gastroenterology

## 2017-11-14 ENCOUNTER — Telehealth: Payer: Self-pay | Admitting: Gastroenterology

## 2017-11-14 NOTE — Telephone Encounter (Signed)
Pt has an upcoming appointment scheduled for 12-24-2017. Last seen in 2011, former Arlyce DiceKaplan pt.

## 2017-11-15 NOTE — Telephone Encounter (Signed)
He hasnt been seen here in many years.  I will not prescribe it until I see him. He can ask PCP to do in meantime

## 2017-11-15 NOTE — Telephone Encounter (Signed)
Pt is using over the counter generic Nexium for now. He understands to see his PCP for refills or wait until his appointment here.

## 2017-12-20 ENCOUNTER — Other Ambulatory Visit: Payer: Self-pay | Admitting: Cardiovascular Disease

## 2017-12-24 ENCOUNTER — Other Ambulatory Visit: Payer: Self-pay | Admitting: Gastroenterology

## 2017-12-24 ENCOUNTER — Ambulatory Visit: Payer: Medicaid Other | Admitting: Gastroenterology

## 2017-12-24 NOTE — Progress Notes (Deleted)
[facility] Consult Note:  History: [name] 12/24/2017  Referring physician: System, Pcp Not In  Reason for consult/chief complaint: No chief complaint on file.   Subjective  HPI:  *** Last seen by Arlyce DiceKaplan in Nov 2011 for IDA, colon normal, EGD with severe reflux esophagitis.  Not seen since, was given Rx and samples of nexium for years afterwards.  ROS:  Review of Systems   Past Medical History: Past Medical History:  Diagnosis Date  . Anemia   . HTN (hypertension)   . hyperlipidemia   . Paroxysmal atrial fibrillation [facility](HCC)      Past Surgical History: Past Surgical History:  Procedure Laterality Date  . HERNIA REPAIR    . PENILE PROSTHESIS IMPLANT    . TONSILLECTOMY       Family History: Family History  Problem Relation Age of Onset  . Diabetes Mother   . Stroke Mother   . Heart disease Mother   . Diabetes Father   . Cancer Father        Prostate cancer  . Diabetes Sister     Social History: Social History   Socioeconomic History  . Marital status: Divorced    Spouse name: Not on file  . Number of children: 0  . Years of education: Not on file  . Highest education level: Not on file  Social Needs  . Financial resource strain: Not on file  . Food insecurity - worry: Not on file  . Food insecurity - inability: Not on file  . Transportation needs - medical: Not on file  . Transportation needs - non-medical: Not on file  Occupational History  . Occupation: IT consultantministor    Employer: [employer]  Tobacco Use  . Smoking status: Current Some Day Smoker    Types: Cigars  . Smokeless tobacco: Never Used  Substance and Sexual Activity  . Alcohol use: No  . Drug use: No  . Sexual activity: Not Currently  Other Topics Concern  . Not on file  Social History Narrative  . Not on file    Allergies: No Known Allergies  Outpatient Meds: Current Outpatient Medications  Medication Sig Dispense Refill  . amLODipine  (NORVASC) 10 MG tablet Take 1 tablet (10 mg total) by mouth daily. Patient needs to call and schedule an appointment for further refills 1st attempt 30 tablet 0  . aspirin 81 MG chewable tablet Chew 81 mg by mouth daily.    Marland Kitchen. esomeprazole (NEXIUM) 40 MG capsule TAKE ONE CAPSULE BY MOUTH ONCE DAILY 30 capsule 0  . Hypromellose (ARTIFICIAL TEARS OP) Place 1 drop into both eyes daily as needed (dry eyes).    . metoprolol tartrate (LOPRESSOR) 50 MG tablet Take 1 tablet (50 mg total) by mouth 2 (two) times daily. Patient needs to call and schedule an appointment for further refills 1st attempt 60 tablet 0  . Multiple Vitamins-Minerals (ONE-A-DAY 50 PLUS PO) Take 1 tablet by mouth daily.     . ondansetron (ZOFRAN) 4 MG tablet Take 1 tablet (4 mg total) by mouth every 6 (six) hours. 8 tablet 0  . pravastatin (PRAVACHOL) 40 MG tablet Take one tablet by mouth every evening (Patient taking differently: Take 40 mg by mouth every evening. ) 90 tablet 3  . traMADol (ULTRAM) 50 MG tablet Take 1 tablet (50 mg total) by mouth every 6 (six) hours as needed. 8 tablet 0   No current facility-administered medications for this visit.  ___________________________________________________________________ Objective   Exam:  There were no vitals taken for this visit.   General: this is a(n) ***   Eyes: sclera anicteric, no redness  ENT: oral mucosa moist without lesions, no cervical or supraclavicular lymphadenopathy, good dentition  CV: RRR without murmur, S1/S2, no JVD, no peripheral edema  Resp: clear to auscultation bilaterally, normal RR and effort noted  GI: soft, *** tenderness, with active bowel sounds. No guarding or palpable organomegaly noted.  Skin; warm and dry, no rash or jaundice noted  Neuro: awake, alert and oriented x 3. Normal gross motor function and fluent speech  Labs:  ***  Radiologic Studies:  ***  Assessment: No diagnosis found.  ***  Plan:  ***  Thank you  for the courtesy of this consult.  Please call me with any questions or concerns.  Charlie Pitter III  CC: System, Pcp Not In

## 2017-12-31 ENCOUNTER — Other Ambulatory Visit: Payer: Self-pay | Admitting: Cardiovascular Disease

## 2018-01-20 ENCOUNTER — Other Ambulatory Visit: Payer: Self-pay | Admitting: Cardiovascular Disease

## 2018-01-21 ENCOUNTER — Other Ambulatory Visit: Payer: Self-pay | Admitting: Cardiovascular Disease

## 2018-01-21 MED ORDER — METOPROLOL TARTRATE 50 MG PO TABS: 50.0000 mg | ORAL_TABLET | Freq: Two times a day (BID) | ORAL | 0 refills | Status: DC

## 2018-01-21 MED ORDER — AMLODIPINE BESYLATE 10 MG PO TABS: 10.0000 mg | ORAL_TABLET | Freq: Every day | ORAL | 0 refills | Status: DC

## 2018-01-21 NOTE — Telephone Encounter (Signed)
Pt's medication was sent to pt's pharmacy as requested. Confirmation received.  °

## 2018-02-19 ENCOUNTER — Encounter: Payer: Self-pay | Admitting: Gastroenterology

## 2018-02-19 ENCOUNTER — Other Ambulatory Visit (INDEPENDENT_AMBULATORY_CARE_PROVIDER_SITE_OTHER): Payer: Medicaid Other

## 2018-02-19 ENCOUNTER — Ambulatory Visit: Payer: Medicaid Other | Admitting: Gastroenterology

## 2018-02-19 VITALS — BP 126/82 | HR 76 | Ht 72.0 in | Wt 233.0 lb

## 2018-02-19 DIAGNOSIS — D649 Anemia, unspecified: Secondary | ICD-10-CM

## 2018-02-19 DIAGNOSIS — K2100 Gastro-esophageal reflux disease with esophagitis, without bleeding: Secondary | ICD-10-CM

## 2018-02-19 LAB — CBC WITH DIFFERENTIAL/PLATELET
Basophils Absolute: 0.1 10*3/uL (ref 0.0–0.1)
Basophils Relative: 1.1 % (ref 0.0–3.0)
EOS PCT: 2 % (ref 0.0–5.0)
Eosinophils Absolute: 0.1 10*3/uL (ref 0.0–0.7)
HEMATOCRIT: 35.3 % — AB (ref 39.0–52.0)
HEMOGLOBIN: 11.8 g/dL — AB (ref 13.0–17.0)
Lymphocytes Relative: 38.7 % (ref 12.0–46.0)
Lymphs Abs: 2.8 10*3/uL (ref 0.7–4.0)
MCHC: 33.4 g/dL (ref 30.0–36.0)
MCV: 87.5 fl (ref 78.0–100.0)
MONO ABS: 0.8 10*3/uL (ref 0.1–1.0)
Monocytes Relative: 11.3 % (ref 3.0–12.0)
NEUTROS ABS: 3.4 10*3/uL (ref 1.4–7.7)
Neutrophils Relative %: 46.9 % (ref 43.0–77.0)
PLATELETS: 223 10*3/uL (ref 150.0–400.0)
RBC: 4.04 Mil/uL — ABNORMAL LOW (ref 4.22–5.81)
RDW: 14.9 % (ref 11.5–15.5)
WBC: 7.4 10*3/uL (ref 4.0–10.5)

## 2018-02-19 LAB — IBC PANEL
IRON: 104 ug/dL (ref 42–165)
Saturation Ratios: 29.2 % (ref 20.0–50.0)
Transferrin: 254 mg/dL (ref 212.0–360.0)

## 2018-02-19 LAB — FERRITIN: Ferritin: 16.1 ng/mL — ABNORMAL LOW (ref 22.0–322.0)

## 2018-02-19 NOTE — Progress Notes (Signed)
HPI :  57 year old male with a history of iron deficiency anemia, erosive esophagitis, hypertension, here to establish care with me for his issues, previously followed by Dr. Arlyce DiceKaplan since retired. He has not been seen in 4 years.  He has been taking Nexium 40 mg once a day for reflux since 2011 or so. He states this works well to control his symptoms. He denies any breakthrough heartburn. He denies any dysphagia. No nausea or vomiting. He does not have any abdominal pains. He tries to minimize eating foods that will flare symptoms. He asks about lowering the dose of Nexium of possible. He has no family history of esophageal cancer. He had erosive severe esophagitis on EGD in 2011 thought to be causing anemia, he has never had a follow-up EGD.  He had a colonoscopy in 2011 which was normal. He denies any bowel habit changes. There is no blood in his stools. His grandfather had colon cancer, no other family history of colon cancer.  Last labs shows that his hemoglobin is in the 11's with MCV and 80s.   EGD 09/21/2010 - candida esophagitis, erosive esophagitis with pyloric stenosis Colonoscopy 08/14/2010 - normal  Past Medical History:  Diagnosis Date  . Anemia   . GERD (gastroesophageal reflux disease)   . HTN (hypertension)   . hyperlipidemia   . Kidney stone   . Paroxysmal atrial fibrillation [facility](HCC)      Past Surgical History:  Procedure Laterality Date  . HERNIA REPAIR    . PENILE PROSTHESIS IMPLANT    . TONSILLECTOMY     Family History  Problem Relation Age of Onset  . Diabetes Mother   . Stroke Mother   . Heart disease Mother   . Diabetes Father   . Cancer Father        Prostate cancer  . Diabetes Sister    Social History   Tobacco Use  . Smoking status: Current Some Day Smoker    Types: Cigars  . Smokeless tobacco: Never Used  Substance Use Topics  . Alcohol use: No  . Drug use: No   Current Outpatient Medications  Medication Sig Dispense Refill  .  amLODipine (NORVASC) 10 MG tablet Take 1 tablet (10 mg total) by mouth daily. Please keep upcoming appt in June for future refills. Thank you 90 tablet 0  . aspirin 81 MG chewable tablet Chew 81 mg by mouth daily.    Marland Kitchen. esomeprazole (NEXIUM) 40 MG capsule TAKE ONE CAPSULE BY MOUTH ONCE DAILY 30 capsule 0  . Hypromellose (ARTIFICIAL TEARS OP) Place 1 drop into both eyes daily as needed (dry eyes).    . metoprolol tartrate (LOPRESSOR) 50 MG tablet Take 1 tablet (50 mg total) by mouth 2 (two) times daily. Please keep upcoming appt in June for future refills. Thank you 180 tablet 0  . Multiple Vitamins-Minerals (ONE-A-DAY 50 PLUS PO) Take 1 tablet by mouth daily.     . pravastatin (PRAVACHOL) 40 MG tablet TAKE 1 TABLET BY MOUTH EVERY EVENING. Please keep upcoming appt for further refills. 90 tablet 0   No current facility-administered medications for this visit.    No Known Allergies   Review of Systems: All systems reviewed and negative except where noted in HPI.   Lab Results  Component Value Date   WBC 11.6 (H) 07/05/2017   HGB 11.8 (L) 07/05/2017   HCT 36.2 (L) 07/05/2017   MCV 85.4 07/05/2017   PLT 238 07/05/2017    Lab Results  Component Value  Date   CREATININE 1.22 07/05/2017   BUN 17 07/05/2017   NA 140 07/05/2017   K 4.0 07/05/2017   CL 109 07/05/2017   CO2 24 07/05/2017    Lab Results  Component Value Date   ALT 22 03/09/2016   AST 26 03/09/2016   ALKPHOS 47 03/09/2016   BILITOT 0.5 03/09/2016     Physical Exam: BP 126/82   Pulse 76   Ht 6' (1.829 m)   Wt 233 lb (105.7 kg)   BMI 31.60 kg/m  Constitutional: Pleasant,well-developed, male in no acute distress. HEENT: Normocephalic and atraumatic. Conjunctivae are normal. No scleral icterus. Neck supple.  Cardiovascular: Normal rate, regular rhythm.  Pulmonary/chest: Effort normal and breath sounds normal. No wheezing, rales or rhonchi. Abdominal: Soft, nondistended, nontender.  There are no masses palpable.  No hepatomegaly. Extremities: no edema Lymphadenopathy: No cervical adenopathy noted. Neurological: Alert and oriented to person place and time. Skin: Skin is warm and dry. No rashes noted. Psychiatric: Normal mood and affect. Behavior is normal.   ASSESSMENT AND PLAN: 57 year old male with a history of erosive esophagitis and anemia, here to establish his GI care regarding the following issues:  GERD / erosive esophagitis - symptoms have completely respond on Nexium 40 mg once daily. We discussed long-term potential risks of chronic PPI use, recommend low-dose daily dose needed to control symptoms. He will reduce to 20 mg once a day to see if this continues to control symptoms. Otherwise I discussed with him he had severe erosive esophagitis on his initial EGD. I'm recommending a follow-up endoscopy to ensure no evidence of Barrett's esophagus given that finding. I discussed risks and benefits of endoscopy and anesthesia with him and he wanted to proceed. Further recommendations pending the results.  Anemia - chronic anemia, we'll recheck iron levels and repeat CBC to see where the stents.If his microcytic anemia persists may perform colonoscopy along with EGD. Otherwise if no iron deficiency we'll plan on performing colonoscopy for screening in 2021.  Agree with the plan, all questions answered.  Ileene Patrick, MD [facility]

## 2018-02-19 NOTE — Patient Instructions (Addendum)
If you are age 58 or older, your body mass index should be between 23-30. Your Body mass index is 31.6 kg/m. If this is out of the aforementioned range listed, please consider follow up with your Primary Care Provider.  If you are age 58 or younger, your body mass index should be between 19-25. Your Body mass index is 31.6 kg/m. If this is out of the aformentioned range listed, please consider follow up with your Primary Care Provider.   You have been scheduled for an endoscopy. Please follow written instructions given to you at your visit today. If you use inhalers (even only as needed), please bring them with you on the day of your procedure. Your physician has requested that you go to www.startemmi.com and enter the access code given to you at your visit today. This web site gives a general overview about your procedure. However, you should still follow specific instructions given to you by our office regarding your preparation for the procedure.  Please go to the lab in the basement of our building to have lab work done as you leave today.  Thank you for entrusting me with your care and for choosing [facility], Dr. Ileene PatrickSteven Armbruster

## 2018-02-21 ENCOUNTER — Telehealth: Payer: Self-pay

## 2018-02-21 ENCOUNTER — Other Ambulatory Visit: Payer: Self-pay

## 2018-02-21 DIAGNOSIS — D649 Anemia, unspecified: Secondary | ICD-10-CM

## 2018-02-21 MED ORDER — PEG-KCL-NACL-NASULF-NA ASC-C 140 G PO SOLR: 1.0000 | ORAL | 0 refills | Status: DC

## 2018-02-21 NOTE — Telephone Encounter (Signed)
Patient aware of added on colonoscopy for 5/3.

## 2018-02-21 NOTE — Telephone Encounter (Signed)
Great, thanks for helping to coordinate this Raynelle FanningJulie

## 2018-02-21 NOTE — Telephone Encounter (Signed)
Spoke to patient and he is agreeable to adding on the colonoscopy for next Friday. Patient rescheduled to 0730 start time, prep instructions mailed, Rx sent for Plenvu, also made Amy H/Vanessa aware of add on procedure. [facility] [place] [facility] [place] so have mailed them to him, verbally instructed about when to arrive and stop eating any corn, beans, nuts, raw vegetables 5 days prior.

## 2018-02-24 ENCOUNTER — Telehealth: Payer: Self-pay | Admitting: Gastroenterology

## 2018-02-24 NOTE — Telephone Encounter (Signed)
Patient did receive his prep instructions, verbally went over them on the phone. Patient states that [facility] hopes to have the Plenvu in today or tomorrow.

## 2018-02-28 ENCOUNTER — Encounter: Payer: Medicaid Other | Admitting: Gastroenterology

## 2018-02-28 ENCOUNTER — Encounter: Payer: Self-pay | Admitting: Gastroenterology

## 2018-02-28 ENCOUNTER — Other Ambulatory Visit: Payer: Self-pay

## 2018-02-28 ENCOUNTER — Telehealth: Payer: Self-pay | Admitting: Gastroenterology

## 2018-02-28 ENCOUNTER — Ambulatory Visit (AMBULATORY_SURGERY_CENTER): Payer: Medicaid Other | Admitting: Gastroenterology

## 2018-02-28 VITALS — BP 139/82 | HR 77 | Temp 98.4°F | Resp 12 | Ht 72.0 in | Wt 233.0 lb

## 2018-02-28 DIAGNOSIS — D509 Iron deficiency anemia, unspecified: Secondary | ICD-10-CM | POA: Diagnosis not present

## 2018-02-28 DIAGNOSIS — K2100 Gastro-esophageal reflux disease with esophagitis, without bleeding: Secondary | ICD-10-CM

## 2018-02-28 DIAGNOSIS — K2950 Unspecified chronic gastritis without bleeding: Secondary | ICD-10-CM | POA: Diagnosis not present

## 2018-02-28 DIAGNOSIS — B9681 Helicobacter pylori [H. pylori] as the cause of diseases classified elsewhere: Secondary | ICD-10-CM | POA: Diagnosis not present

## 2018-02-28 MED ORDER — ESOMEPRAZOLE MAGNESIUM 40 MG PO CPDR: 40.0000 mg | DELAYED_RELEASE_CAPSULE | Freq: Two times a day (BID) | ORAL | 1 refills | Status: DC

## 2018-02-28 MED ORDER — FERROUS SULFATE 325 (65 FE) MG PO TABS: 325.0000 mg | ORAL_TABLET | Freq: Two times a day (BID) | ORAL | 1 refills | Status: AC

## 2018-02-28 MED ORDER — SODIUM CHLORIDE 0.9 % IV SOLN
500.0000 mL | Freq: Once | INTRAVENOUS | Status: DC
Start: 2018-02-28 — End: 2018-08-25

## 2018-02-28 MED ORDER — SODIUM CHLORIDE 0.9 % IV SOLN: 500.0000 mL | Freq: Once | INTRAVENOUS | Status: DC

## 2018-02-28 MED ORDER — FERROUS SULFATE 325 (65 FE) MG PO TABS: 325.0000 mg | ORAL_TABLET | Freq: Two times a day (BID) | ORAL | Status: DC

## 2018-02-28 NOTE — Patient Instructions (Addendum)
Increase Nexium to twice daily. Start iron (ferrous sulfate) once or twice daily. Handouts on diverticulosis, hemorrhoids and hiatal hernia given to friend.   YOU HAD AN ENDOSCOPIC PROCEDURE TODAY AT THE [facility]:   Refer to the procedure report that was given to you for any specific questions about what was found during the examination.  If the procedure report does not answer your questions, please call your gastroenterologist to clarify.  If you requested that your care partner not be given the details of your procedure findings, then the procedure report has been included in a sealed envelope for you to review at your convenience later.  YOU SHOULD EXPECT: Some feelings of bloating in the abdomen. Passage of more gas than usual.  Walking can help get rid of the air that was put into your GI tract during the procedure and reduce the bloating. If you had a lower endoscopy (such as a colonoscopy or flexible sigmoidoscopy) you may notice spotting of blood in your stool or on the toilet paper. If you underwent a bowel prep for your procedure, you may not have a normal bowel movement for a few days.  Please Note:  You might notice some irritation and congestion in your nose or some drainage.  This is from the oxygen used during your procedure.  There is no need for concern and it should clear up in a day or so.  SYMPTOMS TO REPORT IMMEDIATELY:   Following lower endoscopy (colonoscopy or flexible sigmoidoscopy):  Excessive amounts of blood in the stool  Significant tenderness or worsening of abdominal pains  Swelling of the abdomen that is new, acute  Fever of 100F or higher   Following upper endoscopy (EGD)  Vomiting of blood or coffee ground material  New chest pain or pain under the shoulder blades  Painful or persistently difficult swallowing  New shortness of breath  Fever of 100F or higher  Black, tarry-looking stools  For urgent or emergent issues, a  gastroenterologist can be reached at any hour by calling [phone].   DIET:  We do recommend a small meal at first, but then you may proceed to your regular diet.  Drink plenty of fluids but you should avoid alcoholic beverages for 24 hours.  ACTIVITY:  You should plan to take it easy for the rest of today and you should NOT DRIVE or use heavy machinery until tomorrow (because of the sedation medicines used during the test).    FOLLOW UP: Our staff will call the number listed on your records the next business day following your procedure to check on you and address any questions or concerns that you may have regarding the information given to you following your procedure. If we do not reach you, we will leave a message.  However, if you are feeling well and you are not experiencing any problems, there is no need to return our call.  We will assume that you have returned to your regular daily activities without incident.  If any biopsies were taken you will be contacted by phone or by letter within the next 1-3 weeks.  Please call us at [phone] if you have not heard about the biopsies in 3 weeks.    SIGNATURES/CONFIDENTIALITY: You and/or your care partner have signed paperwork which will be entered into your electronic medical record.  These signatures attest to the fact that that the information above on your After Visit Summary has been reviewed and is understood.  Full responsibility  of the confidentiality of this discharge information lies with you and/or your care-partner.

## 2018-02-28 NOTE — Telephone Encounter (Signed)
Pt had procedure with Dr. Adela Lank this morning and stated that he increased dose of dexilant to twice a day and also wants him to take iron. He is requesting these sent to [facility] in [place].

## 2018-02-28 NOTE — Op Note (Signed)
[facility] Patient Name: [name] Procedure Date: 02/28/2018 7:31 AM MRN: [id number] Endoscopist: Viviann Spare P. Kirstin Kugler MD, MD Age: 57 Referring MD:  Date of Birth: [birth year] Gender: Male Account #: [id number] Procedure:                Upper GI endoscopy Indications:              Iron deficiency anemia, history of severe                            esophagitis several years ago, reflux controlled on                            nexium  once daily Medicines:                Monitored Anesthesia Care Procedure:                Pre-Anesthesia Assessment:                           - Prior to the procedure, a History and Physical                            was performed, and patient medications and                            allergies were reviewed. The patient's tolerance of                            previous anesthesia was also reviewed. The risks                            and benefits of the procedure and the sedation                            options and risks were discussed with the patient.                            All questions were answered, and informed consent                            was obtained. Prior Anticoagulants: The patient has                            taken no previous anticoagulant or antiplatelet                            agents. ASA Grade Assessment: II - A patient with                            mild systemic disease. After reviewing the risks                            and benefits, the patient was deemed in  satisfactory condition to undergo the procedure.                           After obtaining informed consent, the endoscope was                            passed under direct vision. Throughout the                            procedure, the patient's blood pressure, pulse, and                            oxygen saturations were monitored continuously. The                            Endoscope was introduced through  the mouth, and                            advanced to the second part of duodenum. The upper                            GI endoscopy was accomplished without difficulty.                            The patient tolerated the procedure well. Scope In: Scope Out: Findings:                 Esophagogastric landmarks were identified: the                            Z-line was found at 38 cm, the gastroesophageal                            junction was found at 38 cm and the upper extent of                            the gastric folds was found at 40 cm from the                            incisors.                           A 2 cm hiatal hernia was present.                           Erosions were found along the entire esophagus.                            Biopsies were taken in all portions of the                            esophagus with a cold forceps for histology.  The exam of the esophagus was otherwise normal.                           There was some residual food in the fundus (mild                            amount) who prohibited clear views of this area,                            but no obvious mass lesions or concerning                            pathology. The entire examined stomach was                            otherwise normal.                           Biopsies were taken with a cold forceps in the                            gastric body, at the incisura and in the gastric                            antrum for Helicobacter pylori testing.                           A large diverticulum was found in the second                            portion of the duodenum.                           The exam of the duodenum was otherwise normal. Complications:            No immediate complications. Estimated blood loss:                            Minimal. Estimated Blood Loss:     Estimated blood loss was minimal. Impression:               - Esophagogastric landmarks  identified.                           - 2 cm hiatal hernia.                           - Diffuse mild esophagitis of the entire esophagus.                            Biopsied.                           - Normal stomach. Biopsies for H pylori obtained  given iron deficiency.                           - Duodenal diverticulum. Recommendation:           - Patient has a contact number available for                            emergencies. The signs and symptoms of potential                            delayed complications were discussed with the                            patient. Return to normal activities tomorrow.                            Written discharge instructions were provided to the                            patient.                           - Resume previous diet.                           - Continue present medications.                           - Increase nexium to twice daily                           - Await pathology results. Viviann Spare P. Sevastian Witczak MD, MD 02/28/2018 8:14:03 AM This report has been signed electronically.

## 2018-02-28 NOTE — Telephone Encounter (Signed)
Dr Adela Lank- Please advise... Do you want pt to be on iron? If so, Im glad to send... It also appears he was to increase Nexium to bid dosing, but phone note indicates Dexilant? Did you change pt to Dexilant? If not, likely our noting error but want to make sure... Thanks

## 2018-02-28 NOTE — Progress Notes (Signed)
A and O x3. Report to RN. Tolerated MAC anesthesia well.Teeth unchanged after procedure.

## 2018-02-28 NOTE — Telephone Encounter (Signed)
Thanks Dottie, Sorry I thought this was already done by the endo staff. Nexium  BID #120 RF3 Ferrous sulfate  PO BID #120 RF1  Thanks much

## 2018-02-28 NOTE — Progress Notes (Signed)
Called to room to assist during endoscopic procedure.  Patient ID and intended procedure confirmed with present staff. Received instructions for my participation in the procedure from the performing physician.  

## 2018-02-28 NOTE — Telephone Encounter (Signed)
Rx sent 

## 2018-02-28 NOTE — Op Note (Signed)
[facility] Patient Name: [name] Procedure Date: 02/28/2018 7:31 AM MRN: [id number] Endoscopist: Viviann Spare P. Lakin Rhine MD, MD Age: 57 Referring MD:  Date of Birth: [birth year] Gender: Male Account #: [id number] Procedure:                Colonoscopy Indications:              Iron deficiency anemia Medicines:                Monitored Anesthesia Care Procedure:                Pre-Anesthesia Assessment:                           - Prior to the procedure, a History and Physical                            was performed, and patient medications and                            allergies were reviewed. The patient's tolerance of                            previous anesthesia was also reviewed. The risks                            and benefits of the procedure and the sedation                            options and risks were discussed with the patient.                            All questions were answered, and informed consent                            was obtained. Prior Anticoagulants: The patient has                            taken no previous anticoagulant or antiplatelet                            agents. ASA Grade Assessment: II - A patient with                            mild systemic disease. After reviewing the risks                            and benefits, the patient was deemed in                            satisfactory condition to undergo the procedure.                           After obtaining informed consent, the colonoscope  was passed under direct vision. Throughout the                            procedure, the patient's blood pressure, pulse, and                            oxygen saturations were monitored continuously. The                            Colonoscope was introduced through the anus and                            advanced to the the terminal ileum, with                            identification of the appendiceal orifice and  IC                            valve. The colonoscopy was performed without                            difficulty. The patient tolerated the procedure                            well. The quality of the bowel preparation was                            good. The terminal ileum, ileocecal valve,                            appendiceal orifice, and rectum were photographed. Scope In: 7:43:55 AM Scope Out: 7:59:42 AM Scope Withdrawal Time: 0 hours 12 minutes 19 seconds  Total Procedure Duration: 0 hours 15 minutes 47 seconds  Findings:                 The perianal and digital rectal examinations were                            normal.                           The terminal ileum appeared normal.                           A few small-mouthed diverticula were found in the                            ascending colon.                           Internal hemorrhoids were found during retroflexion.                           The exam was otherwise without abnormality. Complications:            No immediate complications. Estimated blood loss:  None. Estimated Blood Loss:     Estimated blood loss: none. Estimated blood loss:                            none. Impression:               - The examined portion of the ileum was normal.                           - Internal hemorrhoids.                           - The examination was otherwise normal.                           - No polyps.                           No cause for iron deficiency on colonoscopy Recommendation:           - Patient has a contact number available for                            emergencies. The signs and symptoms of potential                            delayed complications were discussed with the                            patient. Return to normal activities tomorrow.                            Written discharge instructions were provided to the                            patient.                            - Resume previous diet.                           - Continue present medications.                           - Repeat colonoscopy in 10 years for screening                            purposes. Viviann Spare P. Naftali Carchi MD, MD 02/28/2018 8:07:22 AM This report has been signed electronically.

## 2018-03-03 ENCOUNTER — Telehealth: Payer: Self-pay

## 2018-03-03 NOTE — Telephone Encounter (Signed)
Called patient, no answer and no answering machine.

## 2018-03-03 NOTE — Telephone Encounter (Signed)
Verbalizes he is doing very well. Picked up his medication that was ordered Friday and is already taking them. He is doing well he tells me.

## 2018-03-03 NOTE — Telephone Encounter (Signed)
Busy

## 2018-03-04 ENCOUNTER — Telehealth: Payer: Self-pay | Admitting: Gastroenterology

## 2018-03-04 NOTE — Telephone Encounter (Signed)
Patient wanted to know if he could take the Nexium 40 mg two capsules at one time in the morning. Advised that this is too much at one time, by splitting it up to twice a day it will work best in control any reflux.

## 2018-03-07 ENCOUNTER — Other Ambulatory Visit: Payer: Self-pay

## 2018-03-07 ENCOUNTER — Telehealth: Payer: Self-pay | Admitting: Gastroenterology

## 2018-03-07 DIAGNOSIS — D509 Iron deficiency anemia, unspecified: Secondary | ICD-10-CM

## 2018-03-07 MED ORDER — METRONIDAZOLE 500 MG PO TABS: 500.0000 mg | ORAL_TABLET | Freq: Two times a day (BID) | ORAL | 0 refills | Status: AC

## 2018-03-07 MED ORDER — AMOXICILLIN 500 MG PO TABS: 1000.0000 mg | ORAL_TABLET | Freq: Two times a day (BID) | ORAL | 0 refills | Status: AC

## 2018-03-07 MED ORDER — CLARITHROMYCIN 500 MG PO TABS: 500.0000 mg | ORAL_TABLET | Freq: Two times a day (BID) | ORAL | 0 refills | Status: DC

## 2018-03-07 NOTE — Telephone Encounter (Signed)
Patient advised of results.

## 2018-03-11 ENCOUNTER — Telehealth: Payer: Self-pay | Admitting: Gastroenterology

## 2018-03-11 NOTE — Telephone Encounter (Signed)
Spoke to patient, he was reading the antibiotic inserts and saw where a possible interaction can occur with his multivitamin. Instructed to not take the multivitamin while on the antibiotics. He was also concerned that his feet were swollen yesterday, they were fine this morning and throughout today. Instructed to monitor.

## 2018-03-11 NOTE — Telephone Encounter (Signed)
Patient states he thinks he is having side affects from medications but not sure which one. Pt requesting to speak with [name].

## 2018-03-18 ENCOUNTER — Telehealth: Payer: Self-pay | Admitting: Gastroenterology

## 2018-03-18 NOTE — Telephone Encounter (Signed)
Pt has some questions regarding amoxicillin. Pls call him.

## 2018-03-19 NOTE — Telephone Encounter (Signed)
Patient has not been taking the amoxicillin as prescribed, only taking 500 mg BID and wanted to know why he has extra pills left. Re-educated patient on the importance of taking it as prescribed and to complete the course of treatment. Confirmed his follow up visit.

## 2018-03-19 NOTE — Telephone Encounter (Signed)
Left message that I was returning his call.

## 2018-03-19 NOTE — Telephone Encounter (Signed)
Pt said he is returning your call 

## 2018-03-31 ENCOUNTER — Ambulatory Visit: Payer: Medicaid Other | Admitting: Cardiovascular Disease

## 2018-04-06 ENCOUNTER — Other Ambulatory Visit: Payer: Self-pay | Admitting: Cardiovascular Disease

## 2018-04-07 MED ORDER — AMLODIPINE BESYLATE 10 MG PO TABS: 10.0000 mg | ORAL_TABLET | Freq: Every day | ORAL | 0 refills | Status: DC

## 2018-04-07 MED ORDER — METOPROLOL TARTRATE 50 MG PO TABS: 50.0000 mg | ORAL_TABLET | Freq: Two times a day (BID) | ORAL | 0 refills | Status: DC

## 2018-04-07 NOTE — Telephone Encounter (Signed)
Pt's medications were sent to pt's pharmacy as requested. Confirmation received.  

## 2018-04-07 NOTE — Telephone Encounter (Signed)
Follow up     *STAT* If patient is at the pharmacy, call can be transferred to refill team.   1. Which medications need to be refilled? (please list name of each medication and dose if known) amLODipine (NORVASC) 10 MG tablet, metoprolol tartrate (LOPRESSOR) 50 MG tablet and pravastatin (PRAVACHOL) 40 MG tablet  2. Which pharmacy/location (including street and city if local pharmacy) is medication to be sent to? [facility] - [place], [place] - [address]  3. Do they need a 30 day or 90 day supply? 30

## 2018-04-15 ENCOUNTER — Telehealth: Payer: Self-pay | Admitting: Nurse Practitioner

## 2018-04-15 NOTE — Telephone Encounter (Signed)
   [facility] Pre-operative Risk Assessment    Request for surgical clearance:  1. What type of surgery is being performed? Urology   2. When is this surgery scheduled? Sept 18, 2019   3. What type of clearance is required (medical clearance vs. Pharmacy clearance to hold med vs. Both)? Pharmacy  4. Are there any medications that need to be held prior to surgery and how long? Request to hold Aspirin for 10 days   5. Practice name and name of physician performing surgery? Dr. Odis Luster, [facility]   6. What is your office phone number [phone]    7.   What is your office fax number [phone]  8.   Anesthesia type (None, local, MAC, general) ? Not indicated   [name] 04/15/2018, 5:10 PM  _________________________________________________________________   (provider comments below)

## 2018-04-16 ENCOUNTER — Other Ambulatory Visit: Payer: Self-pay | Admitting: Cardiology

## 2018-04-16 MED ORDER — METOPROLOL TARTRATE 50 MG PO TABS: 50.0000 mg | ORAL_TABLET | Freq: Two times a day (BID) | ORAL | 0 refills | Status: DC

## 2018-04-16 NOTE — Telephone Encounter (Signed)
Called requested office and notified them that pt's has a upcoming appointment with our office.

## 2018-04-16 NOTE — Telephone Encounter (Signed)
Dr. Clifton JamesMcAlhany, this pt has no known CAD. On ASA for PAF with CHads of 1. Can aspirin be held for 10 days prior to urology procedure?  Please route response back to CV DIV PREOP  Thank you

## 2018-04-16 NOTE — Telephone Encounter (Signed)
ASA can be held. Thanks, chris

## 2018-04-16 NOTE — Telephone Encounter (Signed)
This patient has not been seen in over a year. He has an upcoming appointment with Dr. Clifton JamesMcAlhany on 05/02/18 at which time his clearance can be addressed. Please note clearance in the appointment notes.  Please communicate this to the requesting provider.

## 2018-04-29 IMAGING — CT CT RENAL STONE PROTOCOL
2 of 4 series · 15 of 46 positions shown, 17 images · non-contrast
Comparison: None.

CLINICAL DATA: Two day history of left flank pain

EXAM:
CT ABDOMEN AND PELVIS WITHOUT CONTRAST
TECHNIQUE: Multidetector CT imaging of the abdomen and pelvis was performed
following the standard protocol without oral or intravenous contrast
material administration.

[Series 3: renal stone 5.0 · axial · 0.79mm/px · z∈[+872,+1292]mm · 12 of 96 slices shown, 14 images]
[im 8/96  soft-tissue]
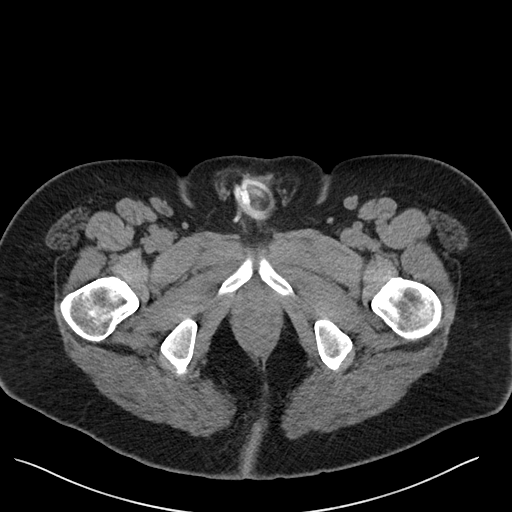
[im 8/96  bone]
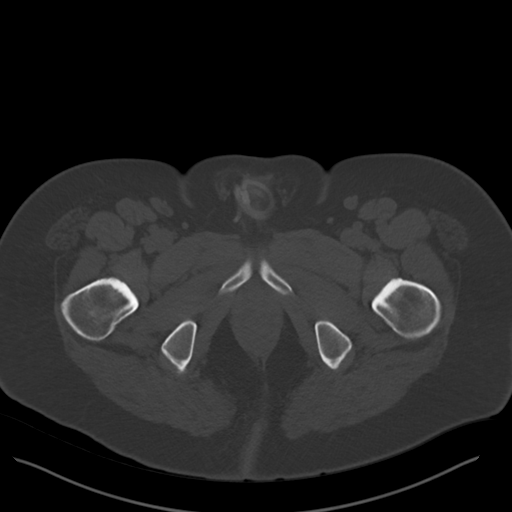
[im 16/96  soft-tissue]
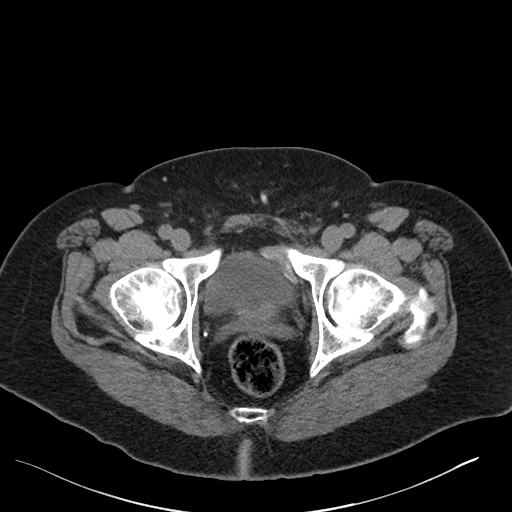
[im 23/96  soft-tissue]
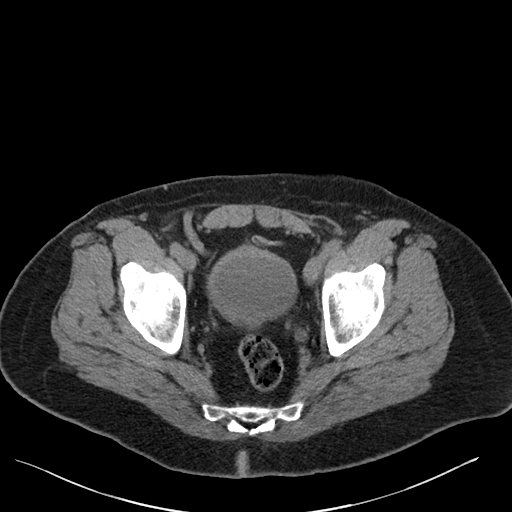
[im 31/96  soft-tissue]
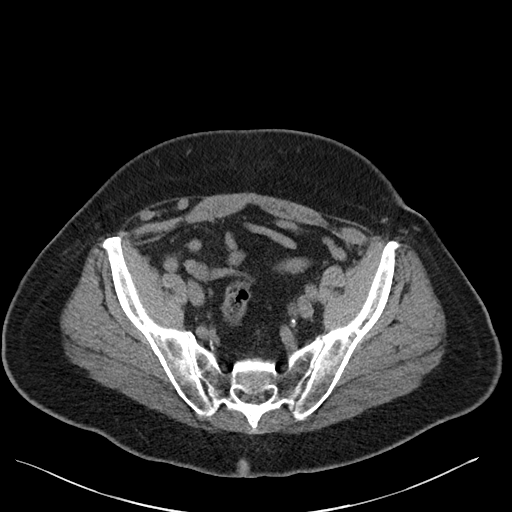
[im 39/96  soft-tissue]
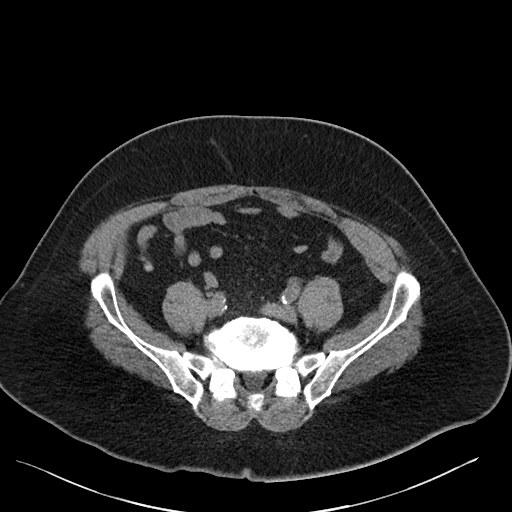
[im 46/96  soft-tissue]
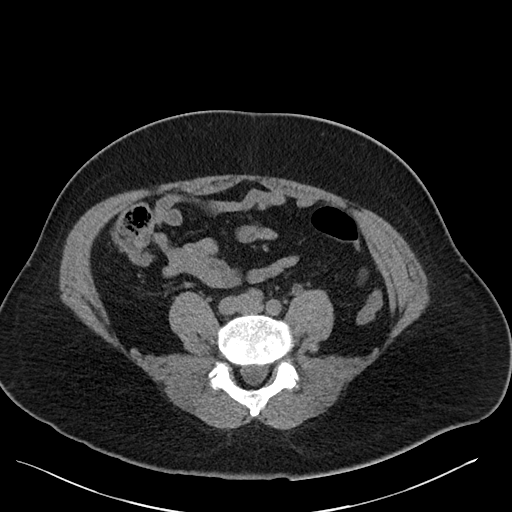
[im 54/96  soft-tissue]
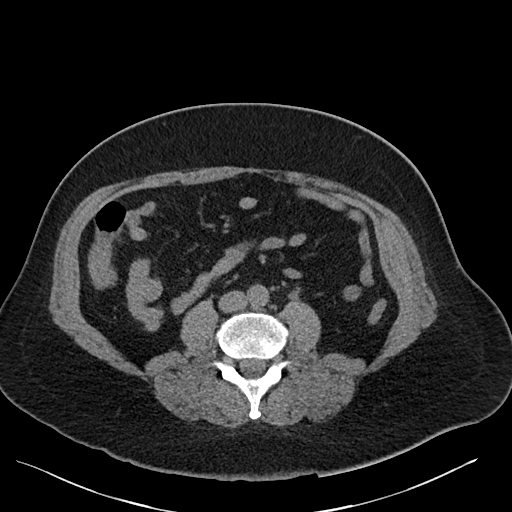
[im 61/96  soft-tissue]
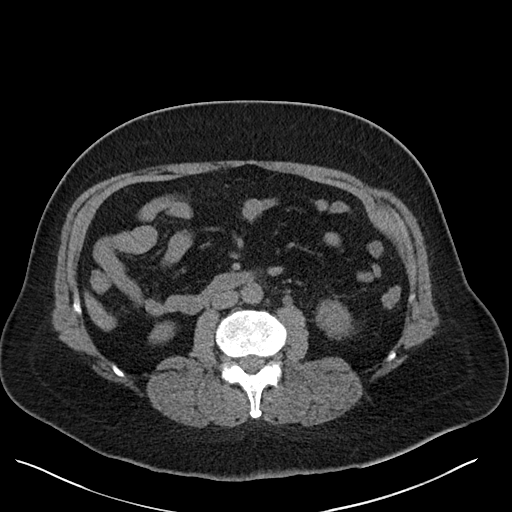
[im 69/96  soft-tissue]
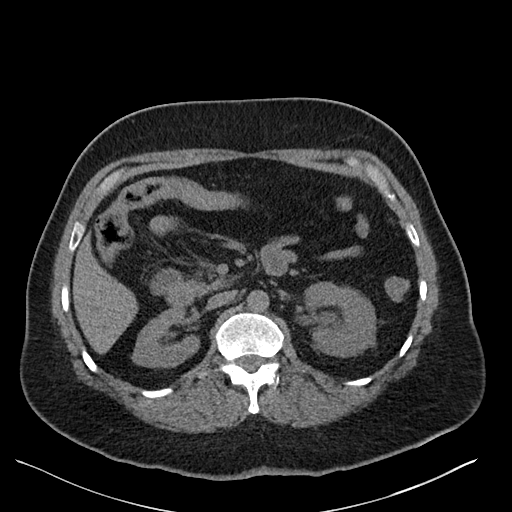
[im 69/96  bone]
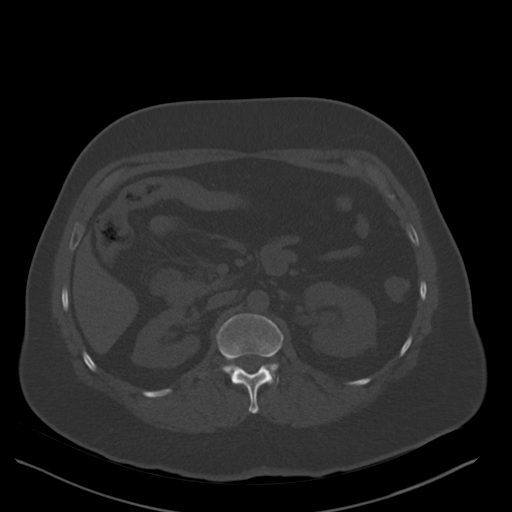
[im 77/96  soft-tissue]
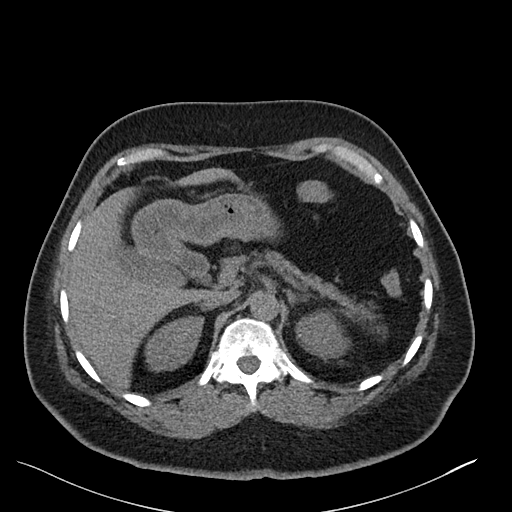
[im 84/96  soft-tissue]
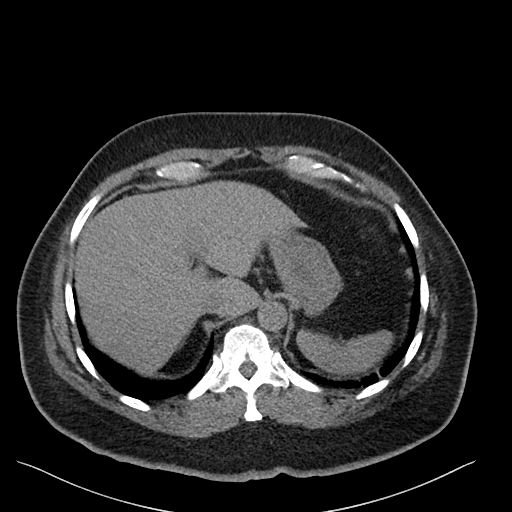
[im 92/96  soft-tissue]
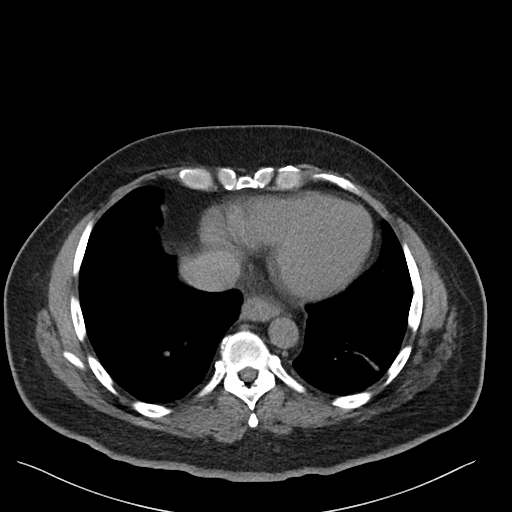

[Series 5: renal stone 3.0 cor · coronal · 0.79mm/px · 3 of 93 slices shown]
[im 31/93  soft-tissue]
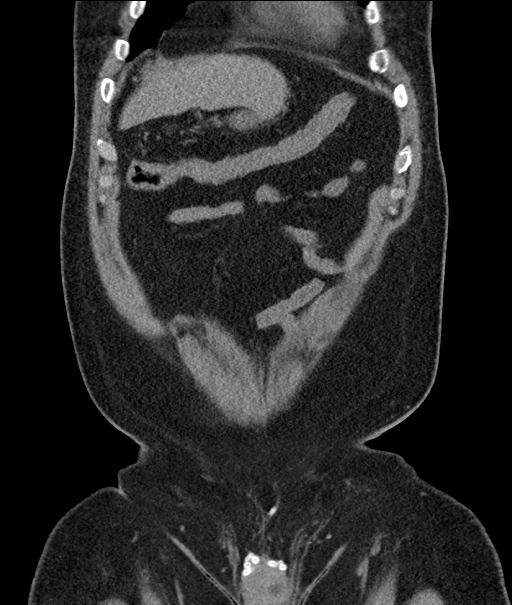
[im 41/93  soft-tissue]
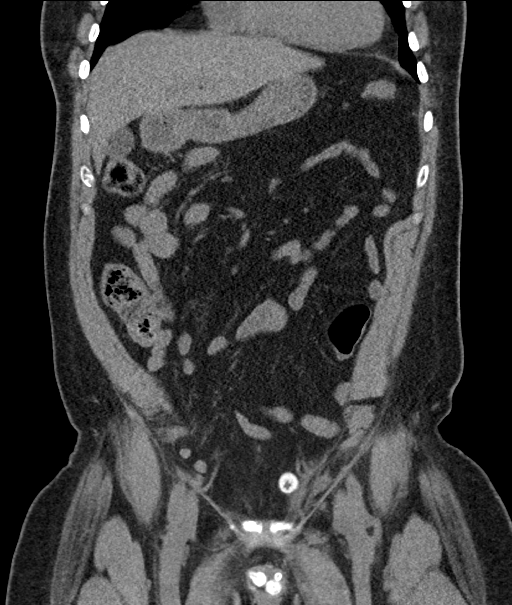
[im 52/93  soft-tissue]
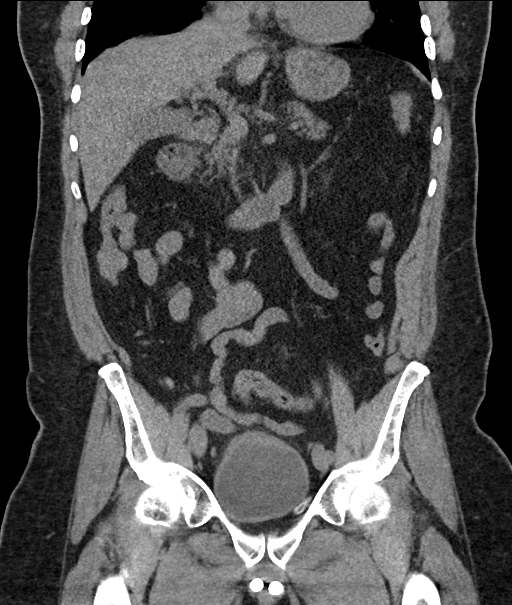

[15 of 46 positions shown; findings below may reference images not displayed]

FINDINGS: Lower chest: There is patchy atelectatic change in the left base.
There is mild scarring in the right middle lobe. There is a small
hiatal hernia.

Hepatobiliary: No focal liver lesions are appreciable on this
noncontrast enhanced study. Gallbladder wall is not appreciably
thickened. There is no biliary duct dilatation.

Pancreas: There is no pancreatic mass or inflammatory focus.

Spleen: No splenic lesions are evident.

Adrenals/Urinary Tract: Adrenals appear unremarkable bilaterally.
There is perinephric stranding and edema involving the left kidney.
There is no renal mass on either side. There is moderate
hydronephrosis on the left. There is no appreciable hydronephrosis
on the right. There is a 5 x 4 mm nonobstructing calculus in the
lower pole of the right kidney. There is no intrarenal calculus on
the left. There is a calculus either at or immediately beyond the
left ureterovesical junction measuring 6 x 4 mm. No other ureteral
region calculi are evident on either side. Urinary bladder is
midline. There is mild thickening of the urinary bladder wall.

Stomach/Bowel: There is no appreciable bowel wall or mesenteric
thickening. There is no evident bowel obstruction. No free air or
portal venous air.

Vascular/Lymphatic: There is atherosclerotic calcification in the
aorta and right common iliac artery. No aneurysm is evident. Major
mesenteric vessels appear patent on this noncontrast enhanced study.
There is no appreciable adenopathy in the abdomen or pelvis.

Reproductive: There are scattered prostatic calculi. Prostate and
seminal vesicles are normal in size and contour. No evident pelvic
mass. There is a penile prosthesis with reservoir anterior to the
bladder on the left in the lower pelvic region. The reservoir
appears decompressed.

Other: Appendix appears normal. No abscess or ascites is evident in
the abdomen or pelvis. There is a rather minimal ventral hernia
containing only fat.

:
Musculoskeletal: There is degenerative change in the lumbar spine.
There is vacuum phenomenon with disc space narrowing at L5-S1. There
are no blastic or lytic bone lesions. There is no intramuscular or
abdominal wall lesion.
IMPRESSION: 1. 6 x 4 mm calculus either within or immediately distal to the left
ureterovesical junction. There is moderate hydronephrosis in
ureterectasis on the left with left renal edema.

2. Nonobstructing 5 x 4 mm calculus lower pole right kidney. No
hydronephrosis on the right.

3. Penile prosthesis. Decompressed reservoir anterior to the urinary
bladder on the left.

4. Urinary bladder wall thickening, felt to represent a degree of
cystitis.

5.  Several prostatic calculi present.

6. No bowel wall thickening or bowel obstruction. No abscess.
Appendix appears normal.

7. Small hiatal hernia. Rather minimal ventral hernia containing
only fat.

8.  Aortoiliac atherosclerosis.

Aortic Atherosclerosis (QOM83-VW5.5).

## 2018-04-29 NOTE — Progress Notes (Signed)
Chief Complaint  Patient presents with  . Follow-up    Atrial fib/pre-op visit   History of Present Illness: 57 yo male with history of paroxysmal atrial fibrillation and hyperlipidemia who is here today for cardiac follow up. I saw him as a new patient 07/23/12. He had been followed in the past by Dr. Tollie Eth. He has been on Flecainide for many years. Cardiac cath 03/23/2008 with normal coronary arteries. Echo 07/10/10 with normal LV size and function. No significant valvular issues.  He is here today for follow up. The patient denies any chest pain, dyspnea, palpitations, lower extremity edema, orthopnea, PND, dizziness, near syncope or syncope. He is feeling well overall. He is planning a revision of his penile implant soon.   Primary Care Physician: System, Pcp Not In  Past Medical History:  Diagnosis Date  . Anemia   . GERD (gastroesophageal reflux disease)   . HTN (hypertension)   . hyperlipidemia   . Kidney stone   . Paroxysmal atrial fibrillation [facility])     Past Surgical History:  Procedure Laterality Date  . HERNIA REPAIR    . PENILE PROSTHESIS IMPLANT    . TONSILLECTOMY      Current Outpatient Medications  Medication Sig Dispense Refill  . amLODipine (NORVASC) 10 MG tablet Take 1 tablet (10 mg total) by mouth daily. Please keep upcoming appt in June for future refills. Thank you 90 tablet 0  . aspirin 81 MG chewable tablet Chew 81 mg by mouth daily.    Marland Kitchen esomeprazole (NEXIUM) 40 MG capsule Take 1 capsule (40 mg total) by mouth 2 (two) times daily. 180 capsule 1  . ferrous sulfate 325 (65 FE) MG tablet Take 1 tablet (325 mg total) by mouth 2 (two) times daily. 180 tablet 1  . Hypromellose (ARTIFICIAL TEARS OP) Place 1 drop into both eyes daily as needed (dry eyes).    . metoprolol tartrate (LOPRESSOR) 50 MG tablet Take 1 tablet (50 mg total) by mouth 2 (two) times daily. 180 tablet 3  . Multiple Vitamins-Minerals (ONE-A-DAY 50 PLUS PO) Take 1 tablet by mouth daily.      . pravastatin (PRAVACHOL) 40 MG tablet TAKE 1 TABLET BY MOUTH EVERY EVENING. Please keep upcoming appt for future refills. Thank you 90 tablet 0   Current Facility-Administered Medications  Medication Dose Route Frequency Provider Last Rate Last Dose  . 0.9 %  sodium chloride infusion  500 mL Intravenous Once Armbruster, Carlota Raspberry, MD      . 0.9 %  sodium chloride infusion  500 mL Intravenous Once Armbruster, Carlota Raspberry, MD        No Known Allergies  Social History   Socioeconomic History  . Marital status: Divorced    Spouse name: Not on file  . Number of children: 0  . Years of education: Not on file  . Highest education level: Not on file  Occupational History  . 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 Financial resource strain: Not on file  . Food insecurity:    Worry: Not on file    Inability: Not on file  . Transportation needs:    Medical: Not on file    Non-medical: Not on file  Tobacco Use  . Smoking status: Current Some Day Smoker    Types: Cigars  . Smokeless tobacco: Never Used  Substance and Sexual Activity  . Alcohol use: No  . Drug use: No  . Sexual activity: Not  Currently  Lifestyle  . Physical activity:    Days per week: Not on file    Minutes per session: Not on file  . Stress: Not on file  Relationships  . Social connections:    Talks on phone: Not on file    Gets together: Not on file    Attends religious service: Not on file    Active member of club or organization: Not on file    Attends meetings of clubs or organizations: Not on file    Relationship status: Not on file  . Intimate partner violence:    Fear of current or ex partner: Not on file    Emotionally abused: Not on file    Physically abused: Not on file    Forced sexual activity: Not on file  Other Topics Concern  . Not on file  Social History Narrative  . Not on file    Family History  Problem Relation Age of Onset  . Diabetes Mother   .  Stroke Mother   . Heart disease Mother   . Diabetes Father   . Cancer Father        Prostate cancer  . Diabetes Sister   . Colon cancer Neg Hx   . Esophageal cancer Neg Hx   . Rectal cancer Neg Hx   . Stomach cancer Neg Hx     Review of Systems:  As stated in the HPI and otherwise negative.   BP 132/78   Pulse 72   Ht 6' (1.829 m)   Wt 233 lb 1.9 oz (105.7 kg)   SpO2 98%   BMI 31.62 kg/m   Physical Examination:  General: Well developed, well nourished, NAD  HEENT: OP clear, mucus membranes moist  SKIN: warm, dry. No rashes. Neuro: No focal deficits  Musculoskeletal: Muscle strength 5/5 all ext  Psychiatric: Mood and affect normal  Neck: No JVD, no carotid bruits, no thyromegaly, no lymphadenopathy.  Lungs:Clear bilaterally, no wheezes, rhonci, crackles Cardiovascular: Regular rate and rhythm. No murmurs, gallops or rubs. Abdomen:Soft. Bowel sounds present. Non-tender.  Extremities: No lower extremity edema. Pulses are 2 + in the bilateral DP/PT.  Cardiac cath 03/23/08: Dr. Wynonia Lawman  ANGIOGRAPHIC DATA: Left ventriculogram: Performed in the 30 degree RAO  projection. The aortic valve is normal. The mitral valve is normal.  The left ventricle appears normal in size. Estimated ejection fraction  is 55%. Coronary arteries arise and distribute normally. There is no  significant coronary artery calcification noted. The left main coronary  artery is normal. Left anterior descending is a large vessel and  contains no significant obstructive disease. The circumflex coronary  artery has a large intermediate branch that is free of disease. The  circumflex has two coronary arteries. He has two marginal arteries.  The right coronary artery is a dominant vessel that contains no  significant disease.   Echo 07/10/10:  Left ventricle: The cavity size was normal. Systolic function was normal. The estimated ejection fraction was in the range of 55% to 60%. Wall motion was normal; there  were no regional wall motion abnormalities.   EKG:  EKG is ordered today. The ekg ordered today demonstrates NSR, rate 72 bpm  Recent Labs: 07/05/2017: BUN 17; Creatinine, Ser 1.22; Potassium 4.0; Sodium 140 02/19/2018: Hemoglobin 11.8; Platelets 223.0   Lipid Panel    Component Value Date/Time   CHOL 143 03/09/2016 0804   TRIG 57 03/09/2016 0804   HDL 60 03/09/2016 0804   CHOLHDL 2.4 03/09/2016 0804  VLDL 11 03/09/2016 0804   LDLCALC 72 03/09/2016 0804     Wt Readings from Last 3 Encounters:  05/02/18 233 lb 1.9 oz (105.7 kg)  02/28/18 233 lb (105.7 kg)  02/19/18 233 lb (105.7 kg)     Other studies Reviewed: Additional studies/ records that were reviewed today include: . Review of the above records demonstrates:    Assessment and Plan:   1. Paroxysmal atrial fibrillation: He is in sinus today. No palpitations at home. His CHADS VASC score is 1.Continue ASA and beta blocker.  Will arrange echo to check LV function.   2. Hyperlipidemia: Lipids last checked in May 2017. LDL was 72. Will  continue statin.  Will check lipids and LFTs.   3. HTN: BP is well controlled. No changes.   4. Pre-operative cardiovascular examination: He is very active. No concerning symptoms. EKG with sinus rhythm. He can hold ASA 7 days before his planned procedure. He can proceed with his planned procedure.   Current medicines are reviewed at length with the patient today.  The patient does not have concerns regarding medicines.  The following changes have been made:  no change  Labs/ tests ordered today include:   Orders Placed This Encounter  Procedures  . Lipid Profile  . Comp Met (CMET)  . EKG 12-Lead  . ECHOCARDIOGRAM COMPLETE     Disposition:   FU with me in 12  months   Signed, Lauree Chandler, MD 05/02/2018 8:57 AM    [facility] [address], [place], [place]  [address] Phone: [phone]; Fax: [phone]

## 2018-05-02 ENCOUNTER — Telehealth: Payer: Self-pay | Admitting: Internal Medicine

## 2018-05-02 ENCOUNTER — Ambulatory Visit: Payer: Medicaid Other | Admitting: Cardiovascular Disease

## 2018-05-02 ENCOUNTER — Telehealth: Payer: Self-pay | Admitting: Cardiovascular Disease

## 2018-05-02 ENCOUNTER — Encounter: Payer: Self-pay | Admitting: Cardiovascular Disease

## 2018-05-02 VITALS — BP 132/78 | HR 72 | Ht 72.0 in | Wt 233.1 lb

## 2018-05-02 DIAGNOSIS — E7800 Pure hypercholesterolemia, unspecified: Secondary | ICD-10-CM | POA: Diagnosis not present

## 2018-05-02 DIAGNOSIS — I10 Essential (primary) hypertension: Secondary | ICD-10-CM

## 2018-05-02 DIAGNOSIS — I480 Paroxysmal atrial fibrillation: Secondary | ICD-10-CM

## 2018-05-02 MED ORDER — METOPROLOL TARTRATE 50 MG PO TABS: 50.0000 mg | ORAL_TABLET | Freq: Two times a day (BID) | ORAL | 3 refills | Status: DC

## 2018-05-02 NOTE — Telephone Encounter (Signed)
Informed patient. Will call back to schedule.

## 2018-05-02 NOTE — Telephone Encounter (Signed)
Completed clearance form and note from today's office visit placed in medical records box to be faxed.

## 2018-05-02 NOTE — Telephone Encounter (Signed)
Copied from CRM [id number]. Topic: Quick Communication - See Telephone Encounter >> May 02, 2018  9:33 AM Herby AbrahamJohnson, Shiquita C wrote: CRM for notification. See Telephone encounter for: 05/02/18.  Pt called in to re-establish care with Dr. Yetta BarreJones. Pt says that he use to see provider a few years ago. Will provider take pt back on? Please assist pt further.   CB: [phone]

## 2018-05-02 NOTE — Telephone Encounter (Signed)
I spoke with pt. He needs to cancel his echo and lab work for July 12,2019. He cannot reschedule today but will call us back to reschedule.

## 2018-05-02 NOTE — Patient Instructions (Signed)
Medication Instructions:  Your physician recommends that you continue on your current medications as directed. Please refer to the Current Medication list given to you today.   Labwork: Your physician recommends that you return for lab work on day of echocardiogram. This will be fasting.  --Lipid profile and CMET   Testing/Procedures: Your physician has requested that you have an echocardiogram. Echocardiography is a painless test that uses sound waves to create images of your heart. It provides your doctor with information about the size and shape of your heart and how well your heart's chambers and valves are working. This procedure takes approximately one hour. There are no restrictions for this procedure.    Follow-Up: Your physician recommends that you schedule a follow-up appointment in: 12 months. Please call our office in about 8 months to schedule this appointment    Any Other Special Instructions Will Be Listed Below (If Applicable).     If you need a refill on your cardiac medications before your next appointment, please call your pharmacy.

## 2018-05-02 NOTE — Telephone Encounter (Signed)
Dr. Jones, °Would you be willing to see this patient to establish care? ° °

## 2018-05-02 NOTE — Telephone Encounter (Signed)
yes

## 2018-05-02 NOTE — Telephone Encounter (Signed)
New message    Patient calling was seen today - wants to discuss something with the nurse.

## 2018-05-06 ENCOUNTER — Telehealth: Payer: Self-pay | Admitting: Gastroenterology

## 2018-05-06 NOTE — Telephone Encounter (Signed)
Patient needed to reschedule his follow up appointment due to ride issues. Explained it was very important to keep this appointment as he would be reassessed for H pylori/symptoms and given directions on how to retest to see if he has been successfully treated for H pylori.

## 2018-05-08 ENCOUNTER — Ambulatory Visit: Payer: Medicaid Other | Admitting: Gastroenterology

## 2018-05-09 ENCOUNTER — Other Ambulatory Visit (HOSPITAL_COMMUNITY): Payer: Medicaid Other

## 2018-05-09 ENCOUNTER — Other Ambulatory Visit: Payer: Medicaid Other

## 2018-05-19 ENCOUNTER — Other Ambulatory Visit: Payer: Self-pay

## 2018-05-19 ENCOUNTER — Ambulatory Visit (HOSPITAL_COMMUNITY): Payer: Medicaid Other | Attending: Internal Medicine

## 2018-05-19 ENCOUNTER — Other Ambulatory Visit: Payer: Medicaid Other | Admitting: *Deleted

## 2018-05-19 DIAGNOSIS — I10 Essential (primary) hypertension: Secondary | ICD-10-CM | POA: Diagnosis not present

## 2018-05-19 DIAGNOSIS — I480 Paroxysmal atrial fibrillation: Secondary | ICD-10-CM | POA: Diagnosis not present

## 2018-05-19 DIAGNOSIS — E7800 Pure hypercholesterolemia, unspecified: Secondary | ICD-10-CM

## 2018-05-19 DIAGNOSIS — E785 Hyperlipidemia, unspecified: Secondary | ICD-10-CM | POA: Insufficient documentation

## 2018-05-19 LAB — COMPREHENSIVE METABOLIC PANEL
ALBUMIN: 4 g/dL (ref 3.5–5.5)
ALT: 15 IU/L (ref 0–44)
AST: 21 IU/L (ref 0–40)
Albumin/Globulin Ratio: 1.3 (ref 1.2–2.2)
Alkaline Phosphatase: 52 IU/L (ref 39–117)
BUN / CREAT RATIO: 15 (ref 9–20)
BUN: 15 mg/dL (ref 6–24)
Bilirubin Total: 0.4 mg/dL (ref 0.0–1.2)
CO2: 24 mmol/L (ref 20–29)
Calcium: 9.4 mg/dL (ref 8.7–10.2)
Chloride: 104 mmol/L (ref 96–106)
Creatinine, Ser: 1 mg/dL (ref 0.76–1.27)
GFR, EST AFRICAN AMERICAN: 95 mL/min/{1.73_m2} (ref >59)
GFR, EST NON AFRICAN AMERICAN: 83 mL/min/{1.73_m2} (ref >59)
GLUCOSE: 93 mg/dL (ref 65–99)
Globulin, Total: 3 g/dL (ref 1.5–4.5)
Potassium: 4.4 mmol/L (ref 3.5–5.2)
Sodium: 141 mmol/L (ref 134–144)
TOTAL PROTEIN: 7 g/dL (ref 6.0–8.5)

## 2018-05-19 LAB — LIPID PANEL
CHOL/HDL RATIO: 2.4 ratio (ref 0.0–5.0)
Cholesterol, Total: 147 mg/dL (ref 100–199)
HDL: 61 mg/dL (ref >39)
LDL Calculated: 73 mg/dL (ref 0–99)
Triglycerides: 64 mg/dL (ref 0–149)
VLDL Cholesterol Cal: 13 mg/dL (ref 5–40)

## 2018-05-21 ENCOUNTER — Ambulatory Visit: Payer: Medicaid Other | Admitting: Physician Assistant

## 2018-05-29 ENCOUNTER — Telehealth: Payer: Self-pay | Admitting: Gastroenterology

## 2018-05-30 NOTE — Telephone Encounter (Signed)
Patient wanted to know if his last EGD showed any problems with his "stomach lining", I read him the results, discussed again results and recommendations. Reassured patient. Asked him to write down ALL of his questions and discuss them with Dr. Adela LankArmbruster at his follow up visit on 07/04/18.

## 2018-06-10 ENCOUNTER — Ambulatory Visit: Payer: Medicaid Other | Admitting: Physician Assistant

## 2018-06-27 ENCOUNTER — Other Ambulatory Visit: Payer: Self-pay | Admitting: Cardiovascular Disease

## 2018-07-03 ENCOUNTER — Ambulatory Visit: Payer: Medicaid Other | Admitting: Cardiovascular Disease

## 2018-07-04 ENCOUNTER — Ambulatory Visit: Payer: Medicaid Other | Admitting: Gastroenterology

## 2018-07-24 ENCOUNTER — Other Ambulatory Visit: Payer: Self-pay | Admitting: Cardiovascular Disease

## 2018-07-24 NOTE — Telephone Encounter (Signed)
Pt's medication was sent to pt's pharmacy as requested. Confirmation received.  °

## 2018-08-19 ENCOUNTER — Ambulatory Visit: Payer: Medicaid Other | Admitting: Gastroenterology

## 2018-08-25 ENCOUNTER — Other Ambulatory Visit: Payer: Self-pay

## 2018-08-25 ENCOUNTER — Other Ambulatory Visit: Payer: Self-pay | Admitting: Gastroenterology

## 2018-08-25 ENCOUNTER — Telehealth: Payer: Self-pay | Admitting: Gastroenterology

## 2018-08-25 MED ORDER — ESOMEPRAZOLE MAGNESIUM 40 MG PO CPDR: 40.0000 mg | DELAYED_RELEASE_CAPSULE | Freq: Two times a day (BID) | ORAL | 0 refills | Status: DC

## 2018-08-25 NOTE — Telephone Encounter (Signed)
Pt needs rf for nexium sent to [facility] in [place].

## 2018-08-25 NOTE — Telephone Encounter (Signed)
Called pt.  After colonoscopy he was told to take Nexium bid so that's what he has been doing.  Filled Rx for 40 mg bid to get him to November appt.  May decrease to 20 bid as discussed at last office visit in 01-2018 after his next appt.  Will discuss with Dr. At Nov appt.

## 2018-09-17 ENCOUNTER — Ambulatory Visit: Payer: Medicaid Other | Admitting: Gastroenterology

## 2018-09-26 ENCOUNTER — Other Ambulatory Visit: Payer: Self-pay | Admitting: Gastroenterology

## 2018-09-26 DIAGNOSIS — K2100 Gastro-esophageal reflux disease with esophagitis, without bleeding: Secondary | ICD-10-CM

## 2018-09-26 MED ORDER — ESOMEPRAZOLE MAGNESIUM 40 MG PO CPDR: 40.0000 mg | DELAYED_RELEASE_CAPSULE | Freq: Two times a day (BID) | ORAL | 1 refills | Status: DC

## 2018-09-28 ENCOUNTER — Encounter

## 2018-10-09 ENCOUNTER — Ambulatory Visit: Payer: Medicaid Other | Admitting: Gastroenterology

## 2018-12-12 ENCOUNTER — Telehealth: Payer: Self-pay | Admitting: Gastroenterology

## 2018-12-12 NOTE — Telephone Encounter (Signed)
Pt would like to speak with you regarding his progress.

## 2018-12-15 NOTE — Telephone Encounter (Signed)
Left message for pt to call back  °

## 2018-12-19 NOTE — Telephone Encounter (Signed)
Pt did not return call. 

## 2018-12-29 ENCOUNTER — Other Ambulatory Visit: Payer: Self-pay | Admitting: Gastroenterology

## 2018-12-29 ENCOUNTER — Telehealth: Payer: Self-pay | Admitting: Gastroenterology

## 2018-12-29 ENCOUNTER — Other Ambulatory Visit: Payer: Self-pay

## 2018-12-29 MED ORDER — ESOMEPRAZOLE MAGNESIUM 40 MG PO CPDR: 40.0000 mg | DELAYED_RELEASE_CAPSULE | Freq: Two times a day (BID) | ORAL | 0 refills | Status: DC

## 2018-12-29 NOTE — Telephone Encounter (Signed)
error 

## 2018-12-29 NOTE — Telephone Encounter (Signed)
Sent enough Nexium to get to appt on 3-10

## 2018-12-29 NOTE — Telephone Encounter (Signed)
Pt has OV January 06, 2019 and requested a refill of Nexium.

## 2019-01-06 ENCOUNTER — Other Ambulatory Visit (INDEPENDENT_AMBULATORY_CARE_PROVIDER_SITE_OTHER): Payer: Medicaid Other

## 2019-01-06 ENCOUNTER — Ambulatory Visit: Payer: Medicaid Other | Admitting: Gastroenterology

## 2019-01-06 ENCOUNTER — Other Ambulatory Visit: Payer: Self-pay

## 2019-01-06 ENCOUNTER — Encounter: Payer: Self-pay | Admitting: Gastroenterology

## 2019-01-06 ENCOUNTER — Telehealth: Payer: Self-pay | Admitting: Gastroenterology

## 2019-01-06 VITALS — BP 96/60 | HR 82 | Ht 72.0 in | Wt 229.0 lb

## 2019-01-06 DIAGNOSIS — Z8619 Personal history of other infectious and parasitic diseases: Secondary | ICD-10-CM | POA: Diagnosis not present

## 2019-01-06 DIAGNOSIS — D509 Iron deficiency anemia, unspecified: Secondary | ICD-10-CM

## 2019-01-06 DIAGNOSIS — K219 Gastro-esophageal reflux disease without esophagitis: Secondary | ICD-10-CM | POA: Diagnosis not present

## 2019-01-06 LAB — CBC WITH DIFFERENTIAL/PLATELET
Basophils Absolute: 0.1 10*3/uL (ref 0.0–0.1)
Basophils Relative: 0.8 % (ref 0.0–3.0)
EOS ABS: 0.1 10*3/uL (ref 0.0–0.7)
EOS PCT: 1.7 % (ref 0.0–5.0)
HCT: 36.8 % — ABNORMAL LOW (ref 39.0–52.0)
Hemoglobin: 12.3 g/dL — ABNORMAL LOW (ref 13.0–17.0)
LYMPHS PCT: 25.9 % (ref 12.0–46.0)
Lymphs Abs: 2.1 10*3/uL (ref 0.7–4.0)
MCHC: 33.3 g/dL (ref 30.0–36.0)
MCV: 88.7 fl (ref 78.0–100.0)
MONOS PCT: 12 % (ref 3.0–12.0)
Monocytes Absolute: 1 10*3/uL (ref 0.1–1.0)
Neutro Abs: 4.8 10*3/uL (ref 1.4–7.7)
Neutrophils Relative %: 59.6 % (ref 43.0–77.0)
Platelets: 205 10*3/uL (ref 150.0–400.0)
RBC: 4.15 Mil/uL — ABNORMAL LOW (ref 4.22–5.81)
RDW: 15.1 % (ref 11.5–15.5)
WBC: 8 10*3/uL (ref 4.0–10.5)

## 2019-01-06 LAB — IBC + FERRITIN
Ferritin: 78.1 ng/mL (ref 22.0–322.0)
Iron: 101 ug/dL (ref 42–165)
Saturation Ratios: 35.5 % (ref 20.0–50.0)
Transferrin: 203 mg/dL — ABNORMAL LOW (ref 212.0–360.0)

## 2019-01-06 NOTE — Telephone Encounter (Signed)
Pt called in wanting to speak with the nurse. He is wanting to discuss his instructions and wants to make sure he is clear on somethings.

## 2019-01-06 NOTE — Progress Notes (Signed)
HPI :  58 y/o male here for a follow up visit.  I previously saw him in May of last year for iron deficiency anemia. He underwent an EGD and colonoscopy with me at that time. Remarkable findings included a normal colonoscopy, and EGD showed diffuse mild esophagitis (focal erosions - no obvious Barrett's), and H pylori gastritis. At that time his nexium was increased to BID dosing and he was given amoxicillin, clarithromycin, and flagyl to treat the H pylori which he completed. I had thought the esophagitis and H pylori could have been related to the iron deficiency. Plans were to recheck his CBC and iron stores and test for H Pylori eradication. He unfortunately was not able to follow up for financial reasons until this time.   Generally he's been doing well since his last visit. He has not had any reflux symptoms since being on high dose nexium, symptoms well controlled. He denies any dysphagia. No nausea or vomiting. No abdominal pains. He is taking iron daily, stools are dark green, he denies any obvious blood in the stools. He does take a baby aspirin but no NSAIDs. He did endorse completing the H pylori regimen. No FH of colon cancer, no gastric / esophageal cancer.   EGD 02/28/2018 - 2cm HH, diffuse mild esophagus - erosions in the esophagus, residual food in the stomach, large duodenal diverticulum, biopsies positive for H pylori Colonoscopy 02/28/2018 - TI normal, few diverticuli, hemorrhoids, otherwise normal  EGD 09/21/2010 - candida esophagitis, erosive esophagitis with pyloric stenosis Colonoscopy 08/14/2010 - normal  Past Medical History:  Diagnosis Date  . Anemia   . GERD (gastroesophageal reflux disease)   . HTN (hypertension)   . hyperlipidemia   . Kidney stone   . Paroxysmal atrial fibrillation [facility])      Past Surgical History:  Procedure Laterality Date  . HERNIA REPAIR    . PENILE PROSTHESIS IMPLANT    . TONSILLECTOMY     Family History  Problem Relation Age of Onset    . Diabetes Mother   . Stroke Mother   . Heart disease Mother   . Diabetes Father   . Cancer Father        Prostate cancer  . Diabetes Sister   . Colon cancer Neg Hx   . Esophageal cancer Neg Hx   . Rectal cancer Neg Hx   . Stomach cancer Neg Hx    Social History   Tobacco Use  . Smoking status: Current Some Day Smoker    Types: Cigars  . Smokeless tobacco: Never Used  Substance Use Topics  . Alcohol use: No  . Drug use: No   Current Outpatient Medications  Medication Sig Dispense Refill  . amLODipine (NORVASC) 10 MG tablet Take 1 tablet (10 mg total) by mouth daily. 90 tablet 2  . aspirin 81 MG chewable tablet Chew 81 mg by mouth daily.    Marland Kitchen esomeprazole (NEXIUM) 40 MG capsule Take 1 capsule (40 mg total) by mouth 2 (two) times daily. Pt must keep appt on 3-10 for additional refills 30 capsule 0  . ferrous sulfate 325 (65 FE) MG tablet Take 1 tablet (325 mg total) by mouth 2 (two) times daily. 180 tablet 1  . Hypromellose (ARTIFICIAL TEARS OP) Place 1 drop into both eyes daily as needed (dry eyes).    . metoprolol tartrate (LOPRESSOR) 50 MG tablet Take 1 tablet (50 mg total) by mouth 2 (two) times daily. 180 tablet 3  . Multiple Vitamins-Minerals (ONE-A-DAY  50 PLUS PO) Take 1 tablet by mouth daily.     . pravastatin (PRAVACHOL) 40 MG tablet TAKE 1 TABLET BY MOUTH ONCE DAILY IN THE EVENING. 90 tablet 2   No current facility-administered medications for this visit.    No Known Allergies   Review of Systems: All systems reviewed and negative except where noted in HPI.   Lab Results  Component Value Date   WBC 7.4 02/19/2018   HGB 11.8 (L) 02/19/2018   HCT 35.3 (L) 02/19/2018   MCV 87.5 02/19/2018   PLT 223.0 02/19/2018   Lab Results  Component Value Date   IRON 104 02/19/2018   TIBC 322 06/26/2010   FERRITIN 16.1 (L) 02/19/2018     Physical Exam: BP 96/60   Pulse 82   Ht 6' (1.829 m)   Wt 229 lb (103.9 kg)   BMI 31.06 kg/m  Constitutional:  Pleasant,well-developed, male in no acute distress. HEENT: Normocephalic and atraumatic. Conjunctivae are normal. No scleral icterus. Neck supple.  Cardiovascular: Normal rate, regular rhythm.  Pulmonary/chest: Effort normal and breath sounds normal. No wheezing, rales or rhonchi. Abdominal: Soft, nondistended, nontender.  There are no masses palpable. No hepatomegaly. Extremities: no edema Lymphadenopathy: No cervical adenopathy noted. Neurological: Alert and oriented to person place and time. Skin: Skin is warm and dry. No rashes noted. Psychiatric: Normal mood and affect. Behavior is normal.   ASSESSMENT AND PLAN: 58 year old male here for a follow-up visit to discuss the following:  Iron deficiency anemia / history of H. Pylori / GERD - mild diffuse esophagitis without Barrett's on endoscopy in addition to H. Pylori gastritis. This could have caused his iron deficiency. He completed antibiotic therapy for H. Pylori but has not followed up with [place] since then. He warrants eradication testing for H. Pylori and reassessment of his anemia. I discussed options with him. We will check CBC today along with iron studies. If he remains anemic with iron deficiency may consider repeat EGD with biopsies of his stomach to confirm eradication and healing of esophagitis (although was mild). If anemia has resolved, may check a stool antigen to evaluate for H pylori eradication, but he would need to hold PPI for 2 weeks if we do this. His reflux is well controlled with current dose of nexium. He has questions about long term safety of PPI and we discussed risks / benefits of long term use. Wish to use the lowest dose needed to control symptoms of PPI or use of an alternative class such as H2 blocker, will reduce him to once daily nexium right now. He agreed with the plan, will let him know results of bloodwork with further recommendations.   Ileene Patrick, MD [facility]

## 2019-01-06 NOTE — Patient Instructions (Signed)
If you are age 59 or older, your body mass index should be between 23-30. Your Body mass index is 31.06 kg/m. If this is out of the aforementioned range listed, please consider follow up with your Primary Care Provider.  If you are age 10 or younger, your body mass index should be between 19-25. Your Body mass index is 31.06 kg/m. If this is out of the aformentioned range listed, please consider follow up with your Primary Care Provider.   Please go to the lab in the basement of our building to have lab work done as you leave today. Hit "B" for basement when you get on the elevator.  When the doors open the lab is on your left.  We will call you with the results. Thank you.  Decrease Nexium 40mg  to once a day.  Thank you for entrusting me with your care and for choosing [facility], Dr. Ileene Patrick

## 2019-01-06 NOTE — Telephone Encounter (Signed)
Notes recorded by Loretha Stapler, RN on 01/06/2019 at 2:29 PM EDT The patient has been notified of this information and all questions answered.  Stool test in Epic and pt verbalized understanding of the instructions. ------

## 2019-01-15 ENCOUNTER — Telehealth: Payer: Self-pay | Admitting: Gastroenterology

## 2019-01-15 NOTE — Telephone Encounter (Signed)
The pt was advised he is not to take any PPI until after his stool study.  The pt has been advised of the information and verbalized understanding.

## 2019-01-15 NOTE — Telephone Encounter (Signed)
Pls call pt, he stated that he needs to have stool test done at the end of March and Dr. Adela Lank told him that he could give him a low dose of Nexium in the meantime.

## 2019-01-20 ENCOUNTER — Telehealth: Payer: Self-pay | Admitting: Internal Medicine

## 2019-01-20 NOTE — Telephone Encounter (Signed)
A new hem appt has been scheduled for the pt to see Dr. Melton Alar on 4/1 at 1pm. Pt aware to arrive 15 minutes early.

## 2019-01-21 ENCOUNTER — Other Ambulatory Visit: Payer: Self-pay | Admitting: Gastroenterology

## 2019-01-21 ENCOUNTER — Telehealth: Payer: Self-pay | Admitting: Gastroenterology

## 2019-01-21 NOTE — Telephone Encounter (Signed)
Pt took nexium last Monday and today. Discussed with him he cannot have the stool test done until off of PPI for 2 weeks. He will take tums as needed and come for test in 2 more weeks.

## 2019-01-27 ENCOUNTER — Telehealth: Payer: Self-pay | Admitting: Cardiovascular Disease

## 2019-01-27 NOTE — Telephone Encounter (Signed)
Pt called to ask about his lab results from 04/2018... he says that he forgot and he is also being treated for anemia.. he is seeing a Microbiologist... I told him that we are if he needs anything and he will call back I make his follow up for 04/2019

## 2019-01-27 NOTE — Telephone Encounter (Signed)
New Message            Patient has questions about his results.

## 2019-01-28 ENCOUNTER — Inpatient Hospital Stay: Payer: Medicaid Other | Attending: Internal Medicine | Admitting: Internal Medicine

## 2019-01-28 ENCOUNTER — Other Ambulatory Visit: Payer: Self-pay

## 2019-01-28 VITALS — BP 112/75 | HR 87 | Temp 98.3°F | Resp 17 | Ht 72.0 in | Wt 225.4 lb

## 2019-01-28 DIAGNOSIS — K648 Other hemorrhoids: Secondary | ICD-10-CM

## 2019-01-28 DIAGNOSIS — Z79899 Other long term (current) drug therapy: Secondary | ICD-10-CM

## 2019-01-28 DIAGNOSIS — D508 Other iron deficiency anemias: Secondary | ICD-10-CM

## 2019-01-28 DIAGNOSIS — D539 Nutritional anemia, unspecified: Secondary | ICD-10-CM

## 2019-01-28 DIAGNOSIS — K449 Diaphragmatic hernia without obstruction or gangrene: Secondary | ICD-10-CM

## 2019-01-28 DIAGNOSIS — I4891 Unspecified atrial fibrillation: Secondary | ICD-10-CM | POA: Diagnosis not present

## 2019-01-28 DIAGNOSIS — F17290 Nicotine dependence, other tobacco product, uncomplicated: Secondary | ICD-10-CM

## 2019-01-28 DIAGNOSIS — I10 Essential (primary) hypertension: Secondary | ICD-10-CM

## 2019-01-28 DIAGNOSIS — Z8249 Family history of ischemic heart disease and other diseases of the circulatory system: Secondary | ICD-10-CM

## 2019-01-28 DIAGNOSIS — D509 Iron deficiency anemia, unspecified: Secondary | ICD-10-CM | POA: Diagnosis not present

## 2019-01-28 DIAGNOSIS — Z8042 Family history of malignant neoplasm of prostate: Secondary | ICD-10-CM

## 2019-01-28 NOTE — Progress Notes (Signed)
Referring Physician:  Dr. Ileene Patrick of [facility]  Diagnosis Other iron deficiency anemia - Plan: CBC with Differential (Cancer Center Only), CMP (Cancer Center only), Lactate dehydrogenase (LDH), Ferritin, Iron and TIBC, Folate, Serum, Vitamin B12, Methylmalonic acid, serum, Hemoglobinopathy evaluation, SPEP with reflex to IFE  Nutritional anemia - Plan: CBC with Differential (Cancer Center Only), CMP (Cancer Center only), Lactate dehydrogenase (LDH), Ferritin, Iron and TIBC, Folate, Serum, Vitamin B12, Methylmalonic acid, serum, Hemoglobinopathy evaluation, SPEP with reflex to IFE  Staging Cancer Staging No matching staging information was found for the patient.  Assessment and Plan:  1. Iron deficiency anemia.  58 year old male referred for evaluation due to iron deficiency anemia.  Pt had labs done 02/19/2018 that showed WBC 7.4 HB 11.8 plts 223,000.  Ferritin was decreased at 16.  He reports he was put on oral iron at that time and is tolerating therapy.  He had colonoscopy and endoscopy done 02/28/2018 that showed hiatal hernia and internal hemorrhoids.  He was recommended to do stool cards by Dr. Adela Lank.  He denies any craving for ice or starch.  He has family history of prostate cancer.  Pt is seen today for consultation due to iron deficiency anemia.    Pt had evidence of IDA on labs done 01/2018 that showed HB 11.8 and ferritin that was decreased at 16.  He has been on oral iron since 01/2018 and is tolerating therapy.  Labs done 01/06/2019 showed WBC 8 HB 12.3 plts 205,000.  Ferritin is improved at 78.  I discussed with pt to continue oral iron as HB adequate at 12 and ferritin is improved at 78.  He should complete stool cards as recommended by GI and is given stool card in office today to return to GI.  He will have repeat labs in 04/2019 and will follow-up at that time to go over results.  He should notify the office if he has any problems prior to that time.  All questions answered  and he expressed understanding of information presented.    2.  Atrial Fibrillation.  HR 87.  Follow-up with PCP or cardiology as directed.    3.  HTN.  BP is 112/75.  Follow-up with PCP as directed.    4.  Health maintenance.   He had colonoscopy and endoscopy done 02/28/2018 that showed hiatal hernia and internal hemorrhoids. Follow-up with GI as recommended.  Pt given stool cards today to return to GI.    30 minutes spent with more than 50% spent in review of records, counseling and coordination of care.    HPI:  58 year old male referred for evaluation due to iron deficiency anemia.  Pt had labs done 02/19/2018 that showed WBC 7.4 HB 11.8 plts 223,000.  Ferritin was decreased at 16.  He reports he was put on oral iron at that time and is tolerating therapy.  He had colonoscopy and endoscopy done 02/28/2018 that showed hiatal hernia and internal hemorrhoids.  He was recommended to do stool cards by Dr. Adela Lank.  He denies any craving for ice or starch.  He has family history of prostate cancer.  Pt is seen today for consultation due to iron deficiency anemia.    Problem List Patient Active Problem List   Diagnosis Date Noted  . Essential hypertension [I10] 12/26/2015  . PAF (paroxysmal atrial fibrillation) (HCC) [I48.0] 06/25/2012  . Other abnormal glucose [R73.09] 04/10/2012  . Pure hypercholesterolemia [E78.00] 04/10/2012  . ANEMIA-IRON DEFICIENCY [D50.9] 07/06/2010  Past Medical History Past Medical History:  Diagnosis Date  . Anemia   . GERD (gastroesophageal reflux disease)   . HTN (hypertension)   . hyperlipidemia   . Kidney stone   . Paroxysmal atrial fibrillation [facility])     Past Surgical History Past Surgical History:  Procedure Laterality Date  . HERNIA REPAIR    . PENILE PROSTHESIS IMPLANT    . TONSILLECTOMY      Family History Family History  Problem Relation Age of Onset  . Diabetes Mother   . Stroke Mother   . Heart disease Mother   . Diabetes Father   .  Cancer Father        Prostate cancer  . Diabetes Sister   . Colon cancer Neg Hx   . Esophageal cancer Neg Hx   . Rectal cancer Neg Hx   . Stomach cancer Neg Hx      Social History  reports that he has been smoking cigars. He has never used smokeless tobacco. He reports that he does not drink alcohol or use drugs.  Medications  Current Outpatient Medications:  .  amLODipine (NORVASC) 10 MG tablet, Take 1 tablet (10 mg total) by mouth daily., Disp: 90 tablet, Rfl: 2 .  aspirin 81 MG chewable tablet, Chew 81 mg by mouth daily., Disp: , Rfl:  .  esomeprazole (NEXIUM) 40 MG capsule, TAKE 1 CAPSULE BY MOUTH TWICE DAILY. PATIENT MUST KEEP APPT ON 3-10 FOR ADDITIONAL REFILLS, Disp: 90 capsule, Rfl: 0 .  ferrous sulfate 325 (65 FE) MG tablet, Take 1 tablet (325 mg total) by mouth 2 (two) times daily., Disp: 180 tablet, Rfl: 1 .  Hypromellose (ARTIFICIAL TEARS OP), Place 1 drop into both eyes daily as needed (dry eyes)., Disp: , Rfl:  .  metoprolol tartrate (LOPRESSOR) 50 MG tablet, Take 1 tablet (50 mg total) by mouth 2 (two) times daily., Disp: 180 tablet, Rfl: 3 .  Multiple Vitamins-Minerals (ONE-A-DAY 50 PLUS PO), Take 1 tablet by mouth daily. , Disp: , Rfl:  .  pravastatin (PRAVACHOL) 40 MG tablet, TAKE 1 TABLET BY MOUTH ONCE DAILY IN THE EVENING., Disp: 90 tablet, Rfl: 2  Allergies Patient has no known allergies.  Review of Systems Review of Systems - Oncology ROS negative   Physical Exam  Vitals Wt Readings from Last 3 Encounters:  01/28/19 225 lb 6.4 oz (102.2 kg)  01/06/19 229 lb (103.9 kg)  05/02/18 233 lb 1.9 oz (105.7 kg)   Temp Readings from Last 3 Encounters:  01/28/19 98.3 F (36.8 C) (Oral)  02/28/18 98.4 F (36.9 C)  07/05/17 98.9 F (37.2 C) (Oral)   BP Readings from Last 3 Encounters:  01/28/19 112/75  01/06/19 96/60  05/02/18 132/78   Pulse Readings from Last 3 Encounters:  01/28/19 87  01/06/19 82  05/02/18 72   Constitutional: Well-developed,  well-nourished, and in no distress.   HENT: Head: Normocephalic and atraumatic.  Mouth/Throat: No oropharyngeal exudate. Mucosa moist. Eyes: Pupils are equal, round, and reactive to light. Conjunctivae are normal. No scleral icterus.  Neck: Normal range of motion. Neck supple. No JVD present.  Cardiovascular: Normal rate, regular rhythm and normal heart sounds.  Exam reveals no gallop and no friction rub.   No murmur heard. Pulmonary/Chest: Effort normal and breath sounds normal. No respiratory distress. No wheezes.No rales.  Abdominal: Soft. Bowel sounds are normal. No distension. There is no tenderness. There is no guarding.  Musculoskeletal: No edema or tenderness.  Lymphadenopathy: No cervical,axillary or supraclavicular  adenopathy.  Neurological: Alert and oriented to person, place, and time. No cranial nerve deficit.  Skin: Skin is warm and dry. No rash noted. No erythema. No pallor.  Psychiatric: Affect and judgment normal.   Labs No visits with results within 3 Day(s) from this visit.  Latest known visit with results is:  Appointment on 01/06/2019  Component Date Value Ref Range Status  . Iron 01/06/2019 101  42 - 165 ug/dL Final  . Transferrin 17/51/0258 203.0* 212.0 - 360.0 mg/dL Final  . Saturation Ratios 01/06/2019 35.5  20.0 - 50.0 % Final  . Ferritin 01/06/2019 78.1  22.0 - 322.0 ng/mL Final  . WBC 01/06/2019 8.0  4.0 - 10.5 K/uL Final  . RBC 01/06/2019 4.15* 4.22 - 5.81 Mil/uL Final  . Hemoglobin 01/06/2019 12.3* 13.0 - 17.0 g/dL Final  . HCT 52/77/8242 36.8* 39.0 - 52.0 % Final  . MCV 01/06/2019 88.7  78.0 - 100.0 fl Final  . MCHC 01/06/2019 33.3  30.0 - 36.0 g/dL Final  . RDW 35/36/1443 15.1  11.5 - 15.5 % Final  . Platelets 01/06/2019 205.0  150.0 - 400.0 K/uL Final  . Neutrophils Relative % 01/06/2019 59.6  43.0 - 77.0 % Final  . Lymphocytes Relative 01/06/2019 25.9  12.0 - 46.0 % Final  . Monocytes Relative 01/06/2019 12.0  3.0 - 12.0 % Final  . Eosinophils  Relative 01/06/2019 1.7  0.0 - 5.0 % Final  . Basophils Relative 01/06/2019 0.8  0.0 - 3.0 % Final  . Neutro Abs 01/06/2019 4.8  1.4 - 7.7 K/uL Final  . Lymphs Abs 01/06/2019 2.1  0.7 - 4.0 K/uL Final  . Monocytes Absolute 01/06/2019 1.0  0.1 - 1.0 K/uL Final  . Eosinophils Absolute 01/06/2019 0.1  0.0 - 0.7 K/uL Final  . Basophils Absolute 01/06/2019 0.1  0.0 - 0.1 K/uL Final     Pathology Orders Placed This Encounter  Procedures  . CBC with Differential (Cancer Center Only)    Standing Status:   Future    Standing Expiration Date:   01/28/2020  . CMP (Cancer Center only)    Standing Status:   Future    Standing Expiration Date:   01/28/2020  . Lactate dehydrogenase (LDH)    Standing Status:   Future    Standing Expiration Date:   01/28/2020  . Ferritin    Standing Status:   Future    Standing Expiration Date:   01/28/2020  . Iron and TIBC    Standing Status:   Future    Standing Expiration Date:   01/28/2020  . Folate, Serum    Standing Status:   Future    Standing Expiration Date:   01/28/2020  . Vitamin B12    Standing Status:   Future    Standing Expiration Date:   01/28/2020  . Methylmalonic acid, serum    Standing Status:   Future    Standing Expiration Date:   01/28/2020  . Hemoglobinopathy evaluation    Standing Status:   Future    Standing Expiration Date:   01/28/2020  . SPEP with reflex to IFE    Standing Status:   Future    Standing Expiration Date:   01/28/2020       Ahmed Prima MD

## 2019-02-10 ENCOUNTER — Other Ambulatory Visit (INDEPENDENT_AMBULATORY_CARE_PROVIDER_SITE_OTHER): Payer: Medicaid Other

## 2019-02-10 DIAGNOSIS — K2100 Gastro-esophageal reflux disease with esophagitis, without bleeding: Secondary | ICD-10-CM

## 2019-02-10 DIAGNOSIS — D649 Anemia, unspecified: Secondary | ICD-10-CM

## 2019-02-10 DIAGNOSIS — D509 Iron deficiency anemia, unspecified: Secondary | ICD-10-CM

## 2019-02-10 LAB — CBC WITH DIFFERENTIAL/PLATELET
Basophils Absolute: 0.1 10*3/uL (ref 0.0–0.1)
Basophils Relative: 0.7 % (ref 0.0–3.0)
Eosinophils Absolute: 0.1 10*3/uL (ref 0.0–0.7)
Eosinophils Relative: 1.1 % (ref 0.0–5.0)
HCT: 35.4 % — ABNORMAL LOW (ref 39.0–52.0)
Hemoglobin: 11.7 g/dL — ABNORMAL LOW (ref 13.0–17.0)
Lymphocytes Relative: 24.7 % (ref 12.0–46.0)
Lymphs Abs: 2.1 10*3/uL (ref 0.7–4.0)
MCHC: 33.2 g/dL (ref 30.0–36.0)
MCV: 89.2 fl (ref 78.0–100.0)
Monocytes Absolute: 0.8 10*3/uL (ref 0.1–1.0)
Monocytes Relative: 9.9 % (ref 3.0–12.0)
Neutro Abs: 5.3 10*3/uL (ref 1.4–7.7)
Neutrophils Relative %: 63.6 % (ref 43.0–77.0)
Platelets: 184 10*3/uL (ref 150.0–400.0)
RBC: 3.96 Mil/uL — ABNORMAL LOW (ref 4.22–5.81)
RDW: 14.9 % (ref 11.5–15.5)
WBC: 8.4 10*3/uL (ref 4.0–10.5)

## 2019-02-10 LAB — IRON, TOTAL/TOTAL IRON BINDING CAP
%SAT: 54 % (calc) — ABNORMAL HIGH (ref 20–48)
Iron: 143 ug/dL (ref 50–180)
TIBC: 266 mcg/dL (calc) (ref 250–425)

## 2019-02-10 NOTE — Addendum Note (Signed)
Addended by: Miguel Aschoff on: 02/10/2019 12:50 PM   Modules accepted: Orders

## 2019-03-02 ENCOUNTER — Other Ambulatory Visit: Payer: Medicaid Other

## 2019-03-02 DIAGNOSIS — D509 Iron deficiency anemia, unspecified: Secondary | ICD-10-CM

## 2019-03-03 LAB — HELICOBACTER PYLORI  SPECIAL ANTIGEN
MICRO NUMBER:: 442666
SPECIMEN QUALITY: ADEQUATE

## 2019-03-04 NOTE — Progress Notes (Signed)
Called patient with results of stool test. He already has an appt. With his Hematologist to F/U on his anemia

## 2019-03-27 ENCOUNTER — Other Ambulatory Visit: Payer: Self-pay | Admitting: Cardiovascular Disease

## 2019-03-27 MED ORDER — PRAVASTATIN SODIUM 40 MG PO TABS: ORAL_TABLET | ORAL | 0 refills | Status: DC

## 2019-04-02 ENCOUNTER — Other Ambulatory Visit: Payer: Self-pay | Admitting: Gastroenterology

## 2019-04-16 ENCOUNTER — Other Ambulatory Visit: Payer: Self-pay | Admitting: Cardiovascular Disease

## 2019-04-30 ENCOUNTER — Telehealth: Payer: Self-pay | Admitting: Internal Medicine

## 2019-04-30 NOTE — Telephone Encounter (Signed)
[facility] off day 7/27 f/u moved to 7/30. Other appointments remain the same.

## 2019-04-30 NOTE — Telephone Encounter (Signed)
Left message. Schedule.

## 2019-05-18 ENCOUNTER — Inpatient Hospital Stay: Payer: Medicaid Other | Attending: Internal Medicine

## 2019-05-18 ENCOUNTER — Other Ambulatory Visit: Payer: Self-pay

## 2019-05-18 ENCOUNTER — Telehealth: Payer: Self-pay | Admitting: Gastroenterology

## 2019-05-18 DIAGNOSIS — I10 Essential (primary) hypertension: Secondary | ICD-10-CM | POA: Diagnosis not present

## 2019-05-18 DIAGNOSIS — Z8042 Family history of malignant neoplasm of prostate: Secondary | ICD-10-CM | POA: Insufficient documentation

## 2019-05-18 DIAGNOSIS — D508 Other iron deficiency anemias: Secondary | ICD-10-CM

## 2019-05-18 DIAGNOSIS — D539 Nutritional anemia, unspecified: Secondary | ICD-10-CM

## 2019-05-18 DIAGNOSIS — I480 Paroxysmal atrial fibrillation: Secondary | ICD-10-CM | POA: Insufficient documentation

## 2019-05-18 DIAGNOSIS — D509 Iron deficiency anemia, unspecified: Secondary | ICD-10-CM | POA: Insufficient documentation

## 2019-05-18 LAB — CBC WITH DIFFERENTIAL (CANCER CENTER ONLY)
Abs Immature Granulocytes: 0.01 10*3/uL (ref 0.00–0.07)
Basophils Absolute: 0.1 10*3/uL (ref 0.0–0.1)
Basophils Relative: 1 %
Eosinophils Absolute: 0.1 10*3/uL (ref 0.0–0.5)
Eosinophils Relative: 1 %
HCT: 34.6 % — ABNORMAL LOW (ref 39.0–52.0)
Hemoglobin: 11.3 g/dL — ABNORMAL LOW (ref 13.0–17.0)
Immature Granulocytes: 0 %
Lymphocytes Relative: 34 %
Lymphs Abs: 2.1 10*3/uL (ref 0.7–4.0)
MCH: 29.1 pg (ref 26.0–34.0)
MCHC: 32.7 g/dL (ref 30.0–36.0)
MCV: 89.2 fL (ref 80.0–100.0)
Monocytes Absolute: 0.7 10*3/uL (ref 0.1–1.0)
Monocytes Relative: 10 %
Neutro Abs: 3.3 10*3/uL (ref 1.7–7.7)
Neutrophils Relative %: 54 %
Platelet Count: 208 10*3/uL (ref 150–400)
RBC: 3.88 MIL/uL — ABNORMAL LOW (ref 4.22–5.81)
RDW: 15 % (ref 11.5–15.5)
WBC Count: 6.2 10*3/uL (ref 4.0–10.5)
nRBC: 0 % (ref 0.0–0.2)

## 2019-05-18 LAB — CMP (CANCER CENTER ONLY)
ALT: 16 U/L (ref 0–44)
AST: 22 U/L (ref 15–41)
Albumin: 3.6 g/dL (ref 3.5–5.0)
Alkaline Phosphatase: 49 U/L (ref 38–126)
Anion gap: 6 (ref 5–15)
BUN: 13 mg/dL (ref 6–20)
CO2: 28 mmol/L (ref 22–32)
Calcium: 8.4 mg/dL — ABNORMAL LOW (ref 8.9–10.3)
Chloride: 108 mmol/L (ref 98–111)
Creatinine: 0.98 mg/dL (ref 0.61–1.24)
GFR, Est AFR Am: >60 mL/min (ref >60)
GFR, Estimated: >60 mL/min (ref >60)
Glucose, Bld: 98 mg/dL (ref 70–99)
Potassium: 4.1 mmol/L (ref 3.5–5.1)
Sodium: 142 mmol/L (ref 135–145)
Total Bilirubin: 0.5 mg/dL (ref 0.3–1.2)
Total Protein: 6.9 g/dL (ref 6.5–8.1)

## 2019-05-18 LAB — IRON AND TIBC
Iron: 106 ug/dL (ref 42–163)
Saturation Ratios: 42 % (ref 20–55)
TIBC: 253 ug/dL (ref 202–409)
UIBC: 147 ug/dL (ref 117–376)

## 2019-05-18 LAB — LACTATE DEHYDROGENASE: LDH: 131 U/L (ref 98–192)

## 2019-05-18 LAB — VITAMIN B12: Vitamin B-12: 689 pg/mL (ref 180–914)

## 2019-05-18 LAB — FERRITIN: Ferritin: 131 ng/mL (ref 24–336)

## 2019-05-18 LAB — FOLATE: Folate: 49.1 ng/mL (ref >5.9)

## 2019-05-18 NOTE — Telephone Encounter (Signed)
Yes I think that would be fine. I think he will be following up with Hematology for this issue - his last iron stores were normal with ongoing anemia, he has a multifactorial anemia, thanks

## 2019-05-18 NOTE — Telephone Encounter (Signed)
Called patient and left message that  Dr. Havery Moros is ok with the change in his Iron supplement to [facility] Iron tabs (325mg /65mg )

## 2019-05-19 LAB — PROTEIN ELECTROPHORESIS, SERUM, WITH REFLEX
A/G Ratio: 1.3 (ref 0.7–1.7)
Albumin ELP: 3.6 g/dL (ref 2.9–4.4)
Alpha-1-Globulin: 0.2 g/dL (ref 0.0–0.4)
Alpha-2-Globulin: 0.6 g/dL (ref 0.4–1.0)
Beta Globulin: 0.8 g/dL (ref 0.7–1.3)
Gamma Globulin: 1.1 g/dL (ref 0.4–1.8)
Globulin, Total: 2.7 g/dL (ref 2.2–3.9)
Total Protein ELP: 6.3 g/dL (ref 6.0–8.5)

## 2019-05-20 ENCOUNTER — Other Ambulatory Visit: Payer: Self-pay | Admitting: Gastroenterology

## 2019-05-20 LAB — HEMOGLOBINOPATHY EVALUATION
Hgb A2 Quant: 2.5 % (ref 1.8–3.2)
Hgb A: 97.5 % (ref 96.4–98.8)
Hgb C: 0 %
Hgb F Quant: 0 % (ref 0.0–2.0)
Hgb S Quant: 0 %
Hgb Variant: 0 %

## 2019-05-20 LAB — METHYLMALONIC ACID, SERUM: Methylmalonic Acid, Quantitative: 133 nmol/L (ref 0–378)

## 2019-05-21 ENCOUNTER — Ambulatory Visit: Payer: Medicaid Other | Admitting: Cardiovascular Disease

## 2019-05-21 ENCOUNTER — Telehealth: Payer: Self-pay | Admitting: Gastroenterology

## 2019-05-21 NOTE — Telephone Encounter (Signed)
Called patient back and let him know that Dr. Havery Moros decreased him back to Nexium - once daily, on his office visit with him on 01/06/19. Patient understands.

## 2019-05-25 ENCOUNTER — Ambulatory Visit: Payer: Medicaid Other | Admitting: Internal Medicine

## 2019-05-25 ENCOUNTER — Telehealth: Payer: Self-pay | Admitting: Internal Medicine

## 2019-05-25 NOTE — Telephone Encounter (Signed)
Called pt per 7/27 sch message to r//s - left message for patient to call back for reschedule.

## 2019-05-28 ENCOUNTER — Ambulatory Visit: Payer: Medicaid Other | Admitting: Internal Medicine

## 2019-05-29 ENCOUNTER — Other Ambulatory Visit: Payer: Self-pay

## 2019-05-29 ENCOUNTER — Inpatient Hospital Stay (HOSPITAL_BASED_OUTPATIENT_CLINIC_OR_DEPARTMENT_OTHER): Payer: Medicaid Other | Admitting: Internal Medicine

## 2019-05-29 VITALS — BP 121/82 | HR 73 | Temp 98.7°F | Resp 18 | Ht 72.0 in | Wt 227.8 lb

## 2019-05-29 DIAGNOSIS — D508 Other iron deficiency anemias: Secondary | ICD-10-CM

## 2019-05-29 DIAGNOSIS — Z8042 Family history of malignant neoplasm of prostate: Secondary | ICD-10-CM

## 2019-05-29 DIAGNOSIS — D509 Iron deficiency anemia, unspecified: Secondary | ICD-10-CM | POA: Diagnosis not present

## 2019-05-29 DIAGNOSIS — I480 Paroxysmal atrial fibrillation: Secondary | ICD-10-CM | POA: Diagnosis not present

## 2019-05-29 DIAGNOSIS — I10 Essential (primary) hypertension: Secondary | ICD-10-CM

## 2019-05-29 NOTE — Progress Notes (Signed)
Diagnosis No diagnosis found.  Staging Cancer Staging No matching staging information was found for the patient.  Assessment and Plan:  1. Iron deficiency anemia.  58 year old male referred for evaluation due to iron deficiency anemia.  Pt had labs done 02/19/2018 that showed WBC 7.4 HB 11.8 plts 223,000.  Ferritin was decreased at 16.  He reports he was put on oral iron at that time and is tolerating therapy.  He had colonoscopy and endoscopy done 02/28/2018 that showed hiatal hernia and internal hemorrhoids.  He was recommended to do stool cards by Dr. Adela LankArmbruster.  He denies any craving for ice or starch.  He has family history of prostate cancer.   Pt had evidence of IDA on labs done 01/2018 that showed HB 11.8 and ferritin that was decreased at 16.  He has been on oral iron since 01/2018 and is tolerating therapy.    Labs done 05/18/2019 reviewed and showed WBC 6.2 HB 11.3 plts 208,000.  Chemistries showed K+ 4.1 Cr 0.98 and normal LFTs.  Ferritin normal at 131, Folate and B12 WNL.  Pt has negative SPEP and HB electrophoresis.   Pt should continue oral iron.  He should follow-up with PCP and GI.  [facility] not [facility] but pt can be referred back if any change in labs.    2.  Atrial Fibrillation.  HR 73.  Follow-up with PCP or cardiology as directed.    3.  HTN.  BP 121/82.  Follow-up with PCP as directed.    4.  Health maintenance.   He had colonoscopy and endoscopy done 02/28/2018 that showed hiatal hernia and internal hemorrhoids. Follow-up with GI as recommended. Pt advised to establish with PCP.  [place] in [place] area.    15  minutes spent with more than 50% spent in review of records, counseling and coordination of care.    Interval History:  Historical data obtained from note dated 01/28/2019.  58 year old male referred for evaluation due to iron deficiency anemia.  Pt had labs done 02/19/2018 that showed WBC 7.4 HB 11.8 plts 223,000.  Ferritin was decreased at 16.  He reports he  was put on oral iron at that time and is tolerating therapy.  He had colonoscopy and endoscopy done 02/28/2018 that showed hiatal hernia and internal hemorrhoids.  He was recommended to do stool cards by Dr. Adela LankArmbruster.  He denies any craving for ice or starch.  He has family history of prostate cancer.    Current Status:  Pt is seen today for follow-up.  He is here to go over labs.    Oncology History   No history exists.     Problem List Patient Active Problem List   Diagnosis Date Noted  . Essential hypertension [I10] 12/26/2015  . PAF (paroxysmal atrial fibrillation) (HCC) [I48.0] 06/25/2012  . Other abnormal glucose [R73.09] 04/10/2012  . Pure hypercholesterolemia [E78.00] 04/10/2012  . ANEMIA-IRON DEFICIENCY [D50.9] 07/06/2010    Past Medical History Past Medical History:  Diagnosis Date  . Anemia   . GERD (gastroesophageal reflux disease)   . HTN (hypertension)   . hyperlipidemia   . Kidney stone   . Paroxysmal atrial fibrillation [facility](HCC)     Past Surgical History Past Surgical History:  Procedure Laterality Date  . HERNIA REPAIR    . PENILE PROSTHESIS IMPLANT    . TONSILLECTOMY      Family History Family History  Problem Relation Age of Onset  . Diabetes Mother   .  Stroke Mother   . Heart disease Mother   . Diabetes Father   . Cancer Father        Prostate cancer  . Diabetes Sister   . Colon cancer Neg Hx   . Esophageal cancer Neg Hx   . Rectal cancer Neg Hx   . Stomach cancer Neg Hx      Social History  reports that he has been smoking cigars. He has never used smokeless tobacco. He reports that he does not drink alcohol or use drugs.  Medications  Current Outpatient Medications:  .  amLODipine (NORVASC) 10 MG tablet, Take 1 tablet by mouth once daily, Disp: 90 tablet, Rfl: 0 .  aspirin 81 MG chewable tablet, Chew 81 mg by mouth daily., Disp: , Rfl:  .  esomeprazole (NEXIUM) 40 MG capsule, Take 1 capsule (40 mg total) by mouth daily., Disp: 90  capsule, Rfl: 1 .  ferrous sulfate 325 (65 FE) MG tablet, Take 1 tablet (325 mg total) by mouth 2 (two) times daily., Disp: 180 tablet, Rfl: 1 .  Hypromellose (ARTIFICIAL TEARS OP), Place 1 drop into both eyes daily as needed (dry eyes)., Disp: , Rfl:  .  metoprolol tartrate (LOPRESSOR) 50 MG tablet, Take 1 tablet by mouth twice daily, Disp: 180 tablet, Rfl: 0 .  Multiple Vitamins-Minerals (ONE-A-DAY 50 PLUS PO), Take 1 tablet by mouth daily. , Disp: , Rfl:  .  pravastatin (PRAVACHOL) 40 MG tablet, TAKE 1 TABLET BY MOUTH ONCE DAILY IN THE EVENING. Please make yearly appt with Dr. Clifton JamesMcalhany for July before anymore refills. 1st attempt, Disp: 90 tablet, Rfl: 0  Allergies Patient has no known allergies.  Review of Systems Review of Systems - Oncology ROS negative   Physical Exam  Vitals Wt Readings from Last 3 Encounters:  05/29/19 227 lb 12.8 oz (103.3 kg)  01/28/19 225 lb 6.4 oz (102.2 kg)  01/06/19 229 lb (103.9 kg)   Temp Readings from Last 3 Encounters:  05/29/19 98.7 F (37.1 C) (Oral)  01/28/19 98.3 F (36.8 C) (Oral)  02/28/18 98.4 F (36.9 C)   BP Readings from Last 3 Encounters:  05/29/19 121/82  01/28/19 112/75  01/06/19 96/60   Pulse Readings from Last 3 Encounters:  05/29/19 73  01/28/19 87  01/06/19 82   Constitutional: Well-developed, well-nourished, and in no distress.   HENT: Head: Normocephalic and atraumatic.  Mouth/Throat: No oropharyngeal exudate. Mucosa moist. Eyes: Pupils are equal, round, and reactive to light. Conjunctivae are normal. No scleral icterus.  Neck: Normal range of motion. Neck supple. No JVD present.  Cardiovascular: Normal rate, regular rhythm and normal heart sounds.  Exam reveals no gallop and no friction rub.   No murmur heard. Pulmonary/Chest: Effort normal and breath sounds normal. No respiratory distress. No wheezes.No rales.  Abdominal: Soft. Bowel sounds are normal. No distension. There is no tenderness. There is no guarding.   Musculoskeletal: No edema or tenderness.  Lymphadenopathy: No cervical, axillary or supraclavicular adenopathy.  Neurological: Alert and oriented to person, place, and time. No cranial nerve deficit.  Skin: Skin is warm and dry. No rash noted. No erythema. No pallor.  Psychiatric: Affect and judgment normal.   Labs No visits with results within 3 Day(s) from this visit.  Latest known visit with results is:  Appointment on 05/18/2019  Component Date Value Ref Range Status  . Total Protein ELP 05/18/2019 6.3  6.0 - 8.5 g/dL Final  . Albumin ELP 45/40/981107/20/2020 3.6  2.9 - 4.4 g/dL Final  .  Alpha-1-Globulin 05/18/2019 0.2  0.0 - 0.4 g/dL Final  . Alpha-2-Globulin 05/18/2019 0.6  0.4 - 1.0 g/dL Final  . Beta Globulin 05/18/2019 0.8  0.7 - 1.3 g/dL Final  . Gamma Globulin 05/18/2019 1.1  0.4 - 1.8 g/dL Final  . M-Spike, % 05/18/2019 Not Observed  Not Observed g/dL Final  . Globulin, Total 05/18/2019 2.7  2.2 - 3.9 g/dL Corrected  . A/G Ratio 05/18/2019 1.3  0.7 - 1.7 Corrected  . Comment 05/18/2019 Comment   Corrected   Comment: (NOTE) Protein electrophoresis scan will follow via computer, mail, or courier delivery.   Marland Kitchen SPEP Interpretation 05/18/2019 Comment   Final   Comment: (NOTE) The SPE pattern appears unremarkable. Evidence of monoclonal protein is not apparent. Performed At: [facility] [place], [place] [id number] Rush Farmer MD PX:[id number]   . Hgb A2 Quant 05/18/2019 2.5  1.8 - 3.2 % Final  . Hgb F Quant 05/18/2019 0.0  0.0 - 2.0 % Final  . Hgb S Quant 05/18/2019 0.0  0.0 % Final  . Hgb C 05/18/2019 0.0  0.0 % Corrected  . Hgb A 05/18/2019 97.5  96.4 - 98.8 % Final  . Hgb Variant 05/18/2019 0.0  0.0 % Corrected  . Please Note: 05/18/2019 Comment   Corrected   Comment: (NOTE) Normal adult hemoglobin present. Performed At: [facility] [place], [place] [id number] Rush Farmer MD FG:[id number]   . Methylmalonic Acid,  Quantitative 05/18/2019 133  0 - 378 nmol/L Final  . Disclaimer: 05/18/2019 Comment   Final   Comment: (NOTE) This test was developed and its performance characteristics determined by [facility]. It has not been cleared or approved by the Food and Drug Administration. Performed At: [facility] [place], [place] [id number] Rush Farmer MD BP:[id number]   . Vitamin B-12 05/18/2019 689  180 - 914 pg/mL Final   Comment: (NOTE) This assay is not validated for testing neonatal or myeloproliferative syndrome specimens for Vitamin B12 levels. Performed at [facility], [place] [address], [place], [place] [address]   . Folate 05/18/2019 49.1  >5.9 ng/mL Final   Comment: RESULTS CONFIRMED BY MANUAL DILUTION Performed at [place] [address], [place], [place] [address]   . Iron 05/18/2019 106  42 - 163 ug/dL Final  . TIBC 05/18/2019 253  202 - 409 ug/dL Final  . Saturation Ratios 05/18/2019 42  20 - 55 % Final  . UIBC 05/18/2019 147  117 - 376 ug/dL Final   Performed at [facility], [place] [address] [place], [place] [address]  . Ferritin 05/18/2019 131  24 - 336 ng/mL Final   Comment: 9 Performed at [facility], [place] [address], [place], [place] [address]   . LDH 05/18/2019 131  98 - 192 U/L Final   Performed at [facility], [place] [address] [place], [place] [address]  . Sodium 05/18/2019 142  135 - 145 mmol/L Final  . Potassium 05/18/2019 4.1  3.5 - 5.1 mmol/L Final  . Chloride 05/18/2019 108  98 - 111 mmol/L Final  . CO2 05/18/2019 28  22 - 32 mmol/L Final  . Glucose, Bld 05/18/2019 98  70 - 99 mg/dL Final  . BUN 05/18/2019 13  6 - 20 mg/dL Final  . Creatinine 05/18/2019 0.98  0.61 - 1.24 mg/dL Final  . Calcium 05/18/2019 8.4* 8.9 - 10.3 mg/dL Final  . Total Protein 05/18/2019  6.9  6.5 - 8.1 g/dL Final  . Albumin 16/10/960407/20/2020 3.6  3.5 - 5.0 g/dL  Final  . AST 54/09/811907/20/2020 22  15 - 41 U/L Final  . ALT 05/18/2019 16  0 - 44 U/L Final  . Alkaline Phosphatase 05/18/2019 49  38 - 126 U/L Final  . Total Bilirubin 05/18/2019 0.5  0.3 - 1.2 mg/dL Final  . GFR, Est Non Af Am 05/18/2019 >60  >60 mL/min Final  . GFR, Est AFR Am 05/18/2019 >60  >60 mL/min Final  . Anion gap 05/18/2019 6  5 - 15 Final   Performed at [facility], [address] [place], [place] [address]  . WBC Count 05/18/2019 6.2  4.0 - 10.5 K/uL Final  . RBC 05/18/2019 3.88* 4.22 - 5.81 MIL/uL Final  . Hemoglobin 05/18/2019 11.3* 13.0 - 17.0 g/dL Final  . HCT 95/62/130807/20/2020 34.6* 39.0 - 52.0 % Final  . MCV 05/18/2019 89.2  80.0 - 100.0 fL Final  . MCH 05/18/2019 29.1  26.0 - 34.0 pg Final  . MCHC 05/18/2019 32.7  30.0 - 36.0 g/dL Final  . RDW 65/78/469607/20/2020 15.0  11.5 - 15.5 % Final  . Platelet Count 05/18/2019 208  150 - 400 K/uL Final  . nRBC 05/18/2019 0.0  0.0 - 0.2 % Final  . Neutrophils Relative % 05/18/2019 54  % Final  . Neutro Abs 05/18/2019 3.3  1.7 - 7.7 K/uL Final  . Lymphocytes Relative 05/18/2019 34  % Final  . Lymphs Abs 05/18/2019 2.1  0.7 - 4.0 K/uL Final  . Monocytes Relative 05/18/2019 10  % Final  . Monocytes Absolute 05/18/2019 0.7  0.1 - 1.0 K/uL Final  . Eosinophils Relative 05/18/2019 1  % Final  . Eosinophils Absolute 05/18/2019 0.1  0.0 - 0.5 K/uL Final  . Basophils Relative 05/18/2019 1  % Final  . Basophils Absolute 05/18/2019 0.1  0.0 - 0.1 K/uL Final  . Immature Granulocytes 05/18/2019 0  % Final  . Abs Immature Granulocytes 05/18/2019 0.01  0.00 - 0.07 K/uL Final   Performed at [facility], [address] [place], [place] [address]     Pathology No orders of the defined types were placed in this encounter.      Ahmed PrimaVetta Stevin Bielinski MD

## 2019-06-23 ENCOUNTER — Other Ambulatory Visit: Payer: Self-pay | Admitting: Cardiovascular Disease

## 2019-07-14 ENCOUNTER — Other Ambulatory Visit: Payer: Self-pay | Admitting: Cardiovascular Disease

## 2019-07-20 ENCOUNTER — Other Ambulatory Visit: Payer: Self-pay | Admitting: Cardiovascular Disease

## 2019-08-18 NOTE — Progress Notes (Addendum)
Chief Complaint  Patient presents with   Follow-up    PAF   History of Present Illness: 58 yo male with history of paroxysmal atrial fibrillation and hyperlipidemia who is here today for cardiac follow up. I saw him as a new patient in 2013. He had been followed in the past by Dr. Viann FishSpencer Tilley. He has been on Flecainide for many years. Cardiac cath 03/23/2008 with normal coronary arteries. Most recent echo July 2019 with LVEF=60-65%, moderate LVH. No valve disease.   He is here today for follow up. The patient denies any chest pain, dyspnea, palpitations, lower extremity edema, orthopnea, PND, dizziness, near syncope or syncope.   Primary Care Physician: System, Pcp Not In  Past Medical History:  Diagnosis Date   Anemia    GERD (gastroesophageal reflux disease)    HTN (hypertension)    hyperlipidemia    Kidney stone    Paroxysmal atrial fibrillation (HCC)     Past Surgical History:  Procedure Laterality Date   HERNIA REPAIR     PENILE PROSTHESIS IMPLANT     TONSILLECTOMY      Current Outpatient Medications  Medication Sig Dispense Refill   aspirin 81 MG chewable tablet Chew 81 mg by mouth daily.     esomeprazole (NEXIUM) 40 MG capsule Take 1 capsule (40 mg total) by mouth daily. 90 capsule 1   ferrous sulfate 325 (65 FE) MG tablet Take 1 tablet (325 mg total) by mouth 2 (two) times daily. 180 tablet 1   Hypromellose (ARTIFICIAL TEARS OP) Place 1 drop into both eyes daily as needed (dry eyes).     metoprolol tartrate (LOPRESSOR) 50 MG tablet Take 1 tablet by mouth twice daily 180 tablet 0   Multiple Vitamins-Minerals (ONE-A-DAY 50 PLUS PO) Take 1 tablet by mouth daily.      pravastatin (PRAVACHOL) 40 MG tablet TAKE 1 TABLET BY MOUTH IN THE EVENING. Please keep upcoming appt in October with Dr. Clifton JamesMcalhany for future refills. Thank you 90 tablet 0   amLODipine (NORVASC) 5 MG tablet Take 1 tablet (5 mg total) by mouth daily. 90 tablet 3   No current  facility-administered medications for this visit.     No Known Allergies  Social History   Socioeconomic History   Marital status: Divorced    Spouse name: Not on file   Number of children: 0   Years of education: Not on file   Highest education level: Not on file  Occupational History   Occupation: IT consultantministor    Employer: [employer]  Social Network [employer]   Financial resource strain: Not on file   Food insecurity    Worry: Not on file    Inability: Not on file   Transportation needs    Medical: Not on file    Non-medical: Not on file  Tobacco Use   Smoking status: Current Some Day Smoker    Types: Cigars   Smokeless tobacco: Never Used  Substance and Sexual Activity   Alcohol use: No   Drug use: No   Sexual activity: Not Currently  Lifestyle   Physical activity    Days per week: Not on file    Minutes per session: Not on file   Stress: Not on file  Relationships   Social connections    Talks on phone: Not on file    Gets together: Not on file    Attends religious service: Not on file    Active member of club or organization: Not on file  Attends meetings of clubs or organizations: Not on file    Relationship status: Not on file   Intimate partner violence    Fear of current or ex partner: Not on file    Emotionally abused: Not on file    Physically abused: Not on file    Forced sexual activity: Not on file  Other Topics Concern   Not on file  Social History Narrative   Not on file    Family History  Problem Relation Age of Onset   Diabetes Mother    Stroke Mother    Heart disease Mother    Diabetes Father    Cancer Father        Prostate cancer   Diabetes Sister    Colon cancer Neg Hx    Esophageal cancer Neg Hx    Rectal cancer Neg Hx    Stomach cancer Neg Hx     Review of Systems:  As stated in the HPI and otherwise negative.   BP 98/64    Pulse 79    Ht 6' (1.829 m)    Wt 234 lb 6.4 oz (106.3 kg)    SpO2 98%    BMI  31.79 kg/m   Physical Examination:  General: Well developed, well nourished, NAD  HEENT: OP clear, mucus membranes moist  SKIN: warm, dry. No rashes. Neuro: No focal deficits  Musculoskeletal: Muscle strength 5/5 all ext  Psychiatric: Mood and affect normal  Neck: No JVD, no carotid bruits, no thyromegaly, no lymphadenopathy.  Lungs:Clear bilaterally, no wheezes, rhonci, crackles Cardiovascular: Regular rate and rhythm. No murmurs, gallops or rubs. Abdomen:Soft. Bowel sounds present. Non-tender.  Extremities: No lower extremity edema. Pulses are 2 + in the bilateral DP/PT.  Cardiac cath 03/23/08: Dr. Donnie Aho  ANGIOGRAPHIC DATA: Left ventriculogram: Performed in the 30 degree RAO  projection. The aortic valve is normal. The mitral valve is normal.  The left ventricle appears normal in size. Estimated ejection fraction  is 55%. Coronary arteries arise and distribute normally. There is no  significant coronary artery calcification noted. The left main coronary  artery is normal. Left anterior descending is a large vessel and  contains no significant obstructive disease. The circumflex coronary  artery has a large intermediate branch that is free of disease. The  circumflex has two coronary arteries. He has two marginal arteries.  The right coronary artery is a dominant vessel that contains no  significant disease.   Echo July 2019: - Left ventricle: The cavity size was normal. Wall thickness was   increased in a pattern of moderate LVH. Systolic function was   normal. The estimated ejection fraction was in the range of 60%   to 65%. Wall motion was normal; there were no regional wall   motion abnormalities. Doppler parameters are consistent with   abnormal left ventricular relaxation (grade 1 diastolic   dysfunction). The E/e&' ratio is between 8-15, suggesting   indeterminate LV filling pressure. - Left atrium: The atrium was normal in size. - Right atrium: The atrium was mildly  dilated. - Inferior vena cava: The vessel was normal in size. The   respirophasic diameter changes were in the normal range (>= 50%),   consistent with normal central venous pressure.  EKG:  EKG is ordered today. The ekg ordered today demonstrates sinus rhythm, chronic subtle lateral ST elevation  Recent Labs: 05/18/2019: ALT 16; BUN 13; Creatinine 0.98; Hemoglobin 11.3; Platelet Count 208; Potassium 4.1; Sodium 142   Lipid Panel  Component Value Date/Time   CHOL 147 05/19/2018 0740   TRIG 64 05/19/2018 0740   HDL 61 05/19/2018 0740   CHOLHDL 2.4 05/19/2018 0740   CHOLHDL 2.4 03/09/2016 0804   VLDL 11 03/09/2016 0804   LDLCALC 73 05/19/2018 0740     Wt Readings from Last 3 Encounters:  08/19/19 234 lb 6.4 oz (106.3 kg)  05/29/19 227 lb 12.8 oz (103.3 kg)  01/28/19 225 lb 6.4 oz (102.2 kg)     Other studies Reviewed: Additional studies/ records that were reviewed today include: . Review of the above records demonstrates:    Assessment and Plan:   1. Paroxysmal atrial fibrillation: Sinus today. No palpitations. His CHADS VASC score is 1.Will continue ASA and beta blocker.    2. Hyperlipidemia: Lipids controlled. Continue statin. Will check lipids and LFTs.   3. HTN: BP is controlled and on the low side. Will lower Norvasc to 5 mg daily.   Current medicines are reviewed at length with the patient today.  The patient does not have concerns regarding medicines.  The following changes have been made:  no change  Labs/ tests ordered today include:   Orders Placed This Encounter  Procedures   Lipid panel   Hepatic function panel   EKG 12-Lead   Disposition:   FU with me in 12  months  Signed, Lauree Chandler, MD 08/19/2019 9:53 AM    [facility] [place], [place], [place]  [address] Phone: [phone]; Fax: [phone]

## 2019-08-19 ENCOUNTER — Ambulatory Visit (INDEPENDENT_AMBULATORY_CARE_PROVIDER_SITE_OTHER): Payer: Medicaid Other | Admitting: Cardiovascular Disease

## 2019-08-19 ENCOUNTER — Other Ambulatory Visit: Payer: Self-pay

## 2019-08-19 ENCOUNTER — Encounter: Payer: Self-pay | Admitting: Cardiovascular Disease

## 2019-08-19 VITALS — BP 98/64 | HR 79 | Ht 72.0 in | Wt 234.4 lb

## 2019-08-19 DIAGNOSIS — E7800 Pure hypercholesterolemia, unspecified: Secondary | ICD-10-CM

## 2019-08-19 DIAGNOSIS — I10 Essential (primary) hypertension: Secondary | ICD-10-CM

## 2019-08-19 DIAGNOSIS — I480 Paroxysmal atrial fibrillation: Secondary | ICD-10-CM

## 2019-08-19 MED ORDER — AMLODIPINE BESYLATE 5 MG PO TABS: 5.0000 mg | ORAL_TABLET | Freq: Every day | ORAL | 3 refills | Status: DC

## 2019-08-19 NOTE — Patient Instructions (Addendum)
Medication Instructions:  Your physician has recommended you make the following change in your medication:  1.) decrease amlodipine to 5 mg daily  *If you need a refill on your cardiac medications before your next appointment, please call your pharmacy*  Lab Work: Please return for fasting labs: lipids/liver panel  If you have labs (blood work) drawn today and your tests are completely normal, you will receive your results only by: Marland Kitchen MyChart Message (if you have MyChart) OR . A paper copy in the mail If you have any lab test that is abnormal or we need to change your treatment, we will call you to review the results.  Testing/Procedures: none  Follow-Up: At [facility], you and your health needs are our priority.  As part of our continuing mission to provide you with exceptional heart care, we have created designated Provider Care Teams.  These Care Teams include your primary Cardiologist (physician) and Advanced Practice Providers (APPs -  Physician Assistants and Nurse Practitioners) who all work together to provide you with the care you need, when you need it.  Your next appointment:   12 months  The format for your next appointment:   In Person                                                    Provider:   Lauree Chandler, MD  Other Instructions

## 2019-08-28 ENCOUNTER — Other Ambulatory Visit: Payer: Self-pay

## 2019-08-28 ENCOUNTER — Other Ambulatory Visit: Payer: Medicaid Other | Admitting: *Deleted

## 2019-08-28 DIAGNOSIS — I480 Paroxysmal atrial fibrillation: Secondary | ICD-10-CM

## 2019-08-28 DIAGNOSIS — E7800 Pure hypercholesterolemia, unspecified: Secondary | ICD-10-CM

## 2019-08-28 DIAGNOSIS — I10 Essential (primary) hypertension: Secondary | ICD-10-CM

## 2019-08-28 LAB — HEPATIC FUNCTION PANEL
ALT: 15 IU/L (ref 0–44)
AST: 22 IU/L (ref 0–40)
Albumin: 4 g/dL (ref 3.8–4.9)
Alkaline Phosphatase: 50 IU/L (ref 39–117)
Bilirubin Total: 0.5 mg/dL (ref 0.0–1.2)
Bilirubin, Direct: 0.14 mg/dL (ref 0.00–0.40)
Total Protein: 6.7 g/dL (ref 6.0–8.5)

## 2019-08-28 LAB — LIPID PANEL
Chol/HDL Ratio: 2.6 ratio (ref 0.0–5.0)
Cholesterol, Total: 167 mg/dL (ref 100–199)
HDL: 65 mg/dL (ref >39)
LDL Chol Calc (NIH): 88 mg/dL (ref 0–99)
Triglycerides: 73 mg/dL (ref 0–149)
VLDL Cholesterol Cal: 14 mg/dL (ref 5–40)

## 2019-08-31 ENCOUNTER — Telehealth: Payer: Self-pay | Admitting: *Deleted

## 2019-08-31 DIAGNOSIS — E7800 Pure hypercholesterolemia, unspecified: Secondary | ICD-10-CM

## 2019-08-31 MED ORDER — PRAVASTATIN SODIUM 80 MG PO TABS: 80.0000 mg | ORAL_TABLET | Freq: Every evening | ORAL | 3 refills | Status: DC

## 2019-08-31 NOTE — Telephone Encounter (Signed)
-----   Message from Burnell Blanks, MD sent at 08/31/2019  8:20 AM EST ----- LFTs ok. Lipids not at goal. LDL is 88. Goal is 70. I would see if he is willing to increase his pravastatin to 80 mg daily and repeat lipids and LFTS in 12 weeks. Thanks, chris

## 2019-08-31 NOTE — Telephone Encounter (Signed)
Spoke with patient.  Reviewed results. He agrees to increase pravastatin to 80 mg daily. Lab appointment made for 12 weeks.

## 2019-10-05 ENCOUNTER — Other Ambulatory Visit: Payer: Self-pay | Admitting: Cardiovascular Disease

## 2019-10-05 MED ORDER — METOPROLOL TARTRATE 50 MG PO TABS: 50.0000 mg | ORAL_TABLET | Freq: Two times a day (BID) | ORAL | 3 refills | Status: DC

## 2019-10-05 NOTE — Addendum Note (Signed)
Addended by: Derl Barrow on: 10/05/2019 04:11 PM   Modules accepted: Orders

## 2019-11-03 ENCOUNTER — Telehealth: Payer: Self-pay | Admitting: Cardiovascular Disease

## 2019-11-03 NOTE — Telephone Encounter (Signed)
Confirmed with patient that he has increased pravastatin to 80 mg and is scheduled for repeat labs 1/27.  Also wanted to know if from a heart perspective if there is a reason he should not get vaccine for Covid19.  I encouraged him to get the vaccine when it is available to him because from a heart perspective there is no contraindication.  Pt appreciative for information provided.

## 2019-11-03 NOTE — Telephone Encounter (Signed)
New Message  Pt is calling and have a couple of questions about his cholestorol and about virus shot  Please call

## 2019-11-17 ENCOUNTER — Other Ambulatory Visit: Payer: Self-pay | Admitting: Gastroenterology

## 2019-11-25 ENCOUNTER — Other Ambulatory Visit: Payer: Medicaid Other

## 2019-11-30 ENCOUNTER — Other Ambulatory Visit: Payer: Self-pay

## 2019-11-30 ENCOUNTER — Other Ambulatory Visit: Payer: Medicaid Other | Admitting: *Deleted

## 2019-11-30 ENCOUNTER — Encounter (INDEPENDENT_AMBULATORY_CARE_PROVIDER_SITE_OTHER): Payer: Self-pay

## 2019-11-30 DIAGNOSIS — E7800 Pure hypercholesterolemia, unspecified: Secondary | ICD-10-CM

## 2019-11-30 LAB — HEPATIC FUNCTION PANEL
ALT: 14 IU/L (ref 0–44)
AST: 24 IU/L (ref 0–40)
Albumin: 4 g/dL (ref 3.8–4.9)
Alkaline Phosphatase: 49 IU/L (ref 39–117)
Bilirubin Total: 0.5 mg/dL (ref 0.0–1.2)
Bilirubin, Direct: 0.14 mg/dL (ref 0.00–0.40)
Total Protein: 6.7 g/dL (ref 6.0–8.5)

## 2019-11-30 LAB — LIPID PANEL
Chol/HDL Ratio: 2.3 ratio (ref 0.0–5.0)
Cholesterol, Total: 154 mg/dL (ref 100–199)
HDL: 66 mg/dL (ref >39)
LDL Chol Calc (NIH): 77 mg/dL (ref 0–99)
Triglycerides: 53 mg/dL (ref 0–149)
VLDL Cholesterol Cal: 11 mg/dL (ref 5–40)

## 2019-12-03 ENCOUNTER — Telehealth: Payer: Self-pay

## 2019-12-03 DIAGNOSIS — E7800 Pure hypercholesterolemia, unspecified: Secondary | ICD-10-CM

## 2019-12-03 DIAGNOSIS — Z79899 Other long term (current) drug therapy: Secondary | ICD-10-CM

## 2019-12-03 NOTE — Telephone Encounter (Signed)
Pt verbalized understanding of is lab results and prefers to work on his diet more and will have repeat fasting labs 02/2020 and if they are still not at goal, he will then reconsider starting a new med.

## 2019-12-03 NOTE — Telephone Encounter (Signed)
-----   Message from Kathleene Hazel, MD sent at 12/01/2019 10:20 AM EST ----- LDL better after increasing his Pravastatin to 80 mg daily but still not at goal of 70. He can either work on his diet or we can consider changing to Crestor 40 mg daily and stopping the Pravastatin. Repeat lipids in 12 weeks with LFTs. Thayer Ohm

## 2019-12-19 ENCOUNTER — Ambulatory Visit: Payer: Medicaid Other | Attending: Internal Medicine

## 2019-12-19 DIAGNOSIS — Z23 Encounter for immunization: Secondary | ICD-10-CM | POA: Insufficient documentation

## 2019-12-19 NOTE — Progress Notes (Signed)
   Covid-19 Vaccination Clinic  Name:  [name]    MRN: [id number] DOB: 1961/08/02  12/19/2019  Mr. [name] was observed post Covid-19 immunization for 15 minutes without incidence. He was provided with Vaccine Information Sheet and instruction to access the V-Safe system.   Mr. [name] was instructed to call 911 with any severe reactions post vaccine: Marland Kitchen Difficulty breathing  . Swelling of your face and throat  . A fast heartbeat  . A bad rash all over your body  . Dizziness and weakness    Immunizations Administered    Name Date Dose VIS Date Route   Pfizer COVID-19 Vaccine 12/19/2019  2:12 PM 0.3 mL 10/09/2019 Intramuscular   Manufacturer: [employer], [employer]   Lot: [id number]   NDC: [address]-5

## 2020-01-11 ENCOUNTER — Telehealth: Payer: Self-pay | Admitting: *Deleted

## 2020-01-11 NOTE — Telephone Encounter (Signed)
I had called the surgeon's office and left message to call back with complete information for surgery being done: need type of surgery as well as type of anesthesia being used.

## 2020-01-12 ENCOUNTER — Ambulatory Visit: Payer: Medicaid Other | Attending: Internal Medicine

## 2020-01-12 DIAGNOSIS — Z23 Encounter for immunization: Secondary | ICD-10-CM

## 2020-01-12 NOTE — Telephone Encounter (Signed)
   [facility] Pre-operative Risk Assessment    Request for surgical clearance:  1. What type of surgery is being performed?  PENILE PROTHESIS REMOVAL & REPLACEMENT   2. When is this surgery scheduled?  02/16/20   3. What type of clearance is required (medical clearance vs. Pharmacy clearance to hold med vs. Both)?  BOTH  4. Are there any medications that need to be held prior to surgery and how long?  ASPIRIN X'S 10 DAYS  5. Practice name and name of physician performing surgery?  [facility] / DR. Odis Luster   6. What is your office phone number [phone]    7.   What is your office fax number [phone]  8.   Anesthesia type (None, local, MAC, general) ?  GENERAL   [name] 01/12/2020, 1:50 PM  _________________________________________________________________   (provider comments below)

## 2020-01-12 NOTE — Progress Notes (Signed)
   Covid-19 Vaccination Clinic  Name:  [name]    MRN: [id number] DOB: 23-Jul-1961  01/12/2020  [name] was observed post Covid-19 immunization for 15 minutes without incident. He was provided with Vaccine Information Sheet and instruction to access the V-Safe system.   [name] was instructed to call 911 with any severe reactions post vaccine: Marland Kitchen Difficulty breathing  . Swelling of face and throat  . A fast heartbeat  . A bad rash all over body  . Dizziness and weakness   Immunizations Administered    Name Date Dose VIS Date Route   Pfizer COVID-19 Vaccine 01/12/2020 12:44 PM 0.3 mL 10/09/2019 Intramuscular   Manufacturer: [employer], [employer]   Lot: [id number]   NDC: [address]-9

## 2020-01-12 NOTE — Telephone Encounter (Signed)
   Primary Cardiologist: Verne Carrow, MD  Chart reviewed as part of pre-operative protocol coverage. Patient was contacted 01/12/2020 in reference to pre-operative risk assessment for pending surgery as outlined below.  [name] was last seen on 07/2019 by Dr.McAlhany.  Since that day, [name] has done well.   Therefore, based on ACC/AHA guidelines, the patient would be at acceptable risk for the planned procedure without further cardiovascular testing.   Per last OV note, hx of  "Paroxysmal atrial fibrillation: Sinus today. No palpitations. His CHADS VASC score is 1.Will continue ASA and beta blocker.  nus today. No palpitations. His CHADS VASC score is 1.Will continue ASA and beta blocker".  Can patient hold Aspirin and how long? Provider requesting to hold for 10 days.   [name], [place] 01/12/2020, 4:19 PM

## 2020-01-12 NOTE — Telephone Encounter (Signed)
Spoke with Corrie Dandy and she is resending the surgical clearance over with the surgical procedure and anesthesia on it.

## 2020-01-13 NOTE — Telephone Encounter (Signed)
OK to hold ASA. cdm 

## 2020-02-11 ENCOUNTER — Other Ambulatory Visit: Payer: Self-pay | Admitting: Gastroenterology

## 2020-03-01 ENCOUNTER — Other Ambulatory Visit: Payer: Self-pay

## 2020-03-01 ENCOUNTER — Other Ambulatory Visit: Payer: Medicaid Other

## 2020-03-01 DIAGNOSIS — E7800 Pure hypercholesterolemia, unspecified: Secondary | ICD-10-CM

## 2020-03-01 DIAGNOSIS — Z79899 Other long term (current) drug therapy: Secondary | ICD-10-CM

## 2020-03-01 LAB — LIPID PANEL
Chol/HDL Ratio: 2.5 ratio (ref 0.0–5.0)
Cholesterol, Total: 146 mg/dL (ref 100–199)
HDL: 59 mg/dL (ref >39)
LDL Chol Calc (NIH): 75 mg/dL (ref 0–99)
Triglycerides: 57 mg/dL (ref 0–149)
VLDL Cholesterol Cal: 12 mg/dL (ref 5–40)

## 2020-03-01 LAB — HEPATIC FUNCTION PANEL
ALT: 15 IU/L (ref 0–44)
AST: 24 IU/L (ref 0–40)
Albumin: 3.9 g/dL (ref 3.8–4.9)
Alkaline Phosphatase: 53 IU/L (ref 39–117)
Bilirubin Total: 0.4 mg/dL (ref 0.0–1.2)
Bilirubin, Direct: 0.09 mg/dL (ref 0.00–0.40)
Total Protein: 6.7 g/dL (ref 6.0–8.5)

## 2020-03-02 ENCOUNTER — Telehealth: Payer: Self-pay | Admitting: *Deleted

## 2020-03-02 DIAGNOSIS — E7800 Pure hypercholesterolemia, unspecified: Secondary | ICD-10-CM

## 2020-03-02 MED ORDER — ROSUVASTATIN CALCIUM 40 MG PO TABS: 40.0000 mg | ORAL_TABLET | Freq: Every day | ORAL | 3 refills | Status: DC

## 2020-03-02 NOTE — Telephone Encounter (Signed)
Spoke with patient. Pravastatin discontinued.  Pt will start Crestor and come for repeat labs in 3 months.

## 2020-03-02 NOTE — Telephone Encounter (Signed)
-----   Message from Kathleene Hazel, MD sent at 03/02/2020  7:37 AM EDT ----- He tried diet alone to get his LDL down below 70 in February 2021. LDL still not below 70. Would change to Crestor 40 mg daily and repeat labs in 12 weeks. Thayer Ohm

## 2020-03-03 ENCOUNTER — Telehealth: Payer: Self-pay | Admitting: Cardiovascular Disease

## 2020-03-03 NOTE — Telephone Encounter (Signed)
There are no vitamins that will lower cholesterol, nor will they interfere with statins.    Some people try red yeast rice to lower cholesterol, but that is a very weak statin, so no point in taking if he's on rosuvastatin.  Only other product would be Cholestoff - it's a plant sterol that can sometimes lower cholesterol.  Can be taken with statins if he wants.

## 2020-03-03 NOTE — Telephone Encounter (Signed)
Left message for patient to call back for advice 

## 2020-03-03 NOTE — Telephone Encounter (Signed)
Message routed to clinical pharmacy to advise

## 2020-03-03 NOTE — Telephone Encounter (Signed)
Patient returning call. Transferred call to the nurse. 

## 2020-03-03 NOTE — Telephone Encounter (Signed)
   Pt c/o medication issue:  1. Name of Medication: rosuvastatin (CRESTOR) 40 MG tablet  2. How are you currently taking this medication (dosage and times per day)?   3. Are you having a reaction (difficulty breathing--STAT)?   4. What is your medication issue? Pt would like to know what vitamins he can take with this medication that can also help to lower his cholesterol  Please advise

## 2020-03-03 NOTE — Telephone Encounter (Signed)
Received call from patient. I reviewed advice from Phillips Hay, [facility] with patient who verbalized understanding. Questions were answered to his satisfaction and he states he will continue rosuvastatin and get his lab work rechecked in 3 months as planned to assess progress. He thanked me for the call.

## 2020-03-07 ENCOUNTER — Telehealth: Payer: Self-pay | Admitting: Cardiovascular Disease

## 2020-03-07 NOTE — Telephone Encounter (Signed)
° °  Pt c/o medication issue:  1. Name of Medication: rosuvastatin (CRESTOR) 40 MG tablet  2. How are you currently taking this medication (dosage and times per day)? Take 1 tablet (40 mg total) by mouth daily.  3. Are you having a reaction (difficulty breathing--STAT)?   4. What is your medication issue? Pt wanted to discuss this drug to a nurse. He said since he started taking this med he started to feel like he has a cold  Please advise

## 2020-03-08 NOTE — Telephone Encounter (Signed)
Patient reports some cold type symptoms that have gotten much better already.  Wondered if related to Crestor.  Discussed common s/e of Crestor and to call back if any muscle./ joint pains or GI symptoms that do not resolve.  He is having some nausea but says it is not that bad and it goes away.  Will call back if does not improve.

## 2020-04-11 ENCOUNTER — Other Ambulatory Visit: Payer: Self-pay | Admitting: Gastroenterology

## 2020-04-13 ENCOUNTER — Telehealth: Payer: Self-pay | Admitting: Gastroenterology

## 2020-04-13 ENCOUNTER — Other Ambulatory Visit: Payer: Self-pay

## 2020-04-13 MED ORDER — ESOMEPRAZOLE MAGNESIUM 40 MG PO CPDR: 40.0000 mg | DELAYED_RELEASE_CAPSULE | Freq: Every day | ORAL | 1 refills | Status: DC

## 2020-06-13 ENCOUNTER — Other Ambulatory Visit: Payer: Medicaid Other

## 2020-06-13 ENCOUNTER — Ambulatory Visit (INDEPENDENT_AMBULATORY_CARE_PROVIDER_SITE_OTHER): Payer: Medicaid Other | Admitting: Gastroenterology

## 2020-06-13 ENCOUNTER — Encounter: Payer: Self-pay | Admitting: Gastroenterology

## 2020-06-13 VITALS — BP 128/80 | HR 76 | Ht 72.0 in | Wt 231.6 lb

## 2020-06-13 DIAGNOSIS — K219 Gastro-esophageal reflux disease without esophagitis: Secondary | ICD-10-CM

## 2020-06-13 DIAGNOSIS — D649 Anemia, unspecified: Secondary | ICD-10-CM

## 2020-06-13 DIAGNOSIS — D509 Iron deficiency anemia, unspecified: Secondary | ICD-10-CM | POA: Diagnosis not present

## 2020-06-13 MED ORDER — ESOMEPRAZOLE MAGNESIUM 40 MG PO CPDR: 40.0000 mg | DELAYED_RELEASE_CAPSULE | Freq: Every day | ORAL | 1 refills | Status: DC

## 2020-06-13 NOTE — Patient Instructions (Addendum)
If you are age 60 or older, your body mass index should be between 23-30. Your Body mass index is 31.41 kg/m. If this is out of the aforementioned range listed, please consider follow up with your Primary Care Provider.  If you are age 18 or younger, your body mass index should be between 19-25. Your Body mass index is 31.41 kg/m. If this is out of the aformentioned range listed, please consider follow up with your Primary Care Provider.   You are due for labs: Testosterone CBC with Diff Iron Ferritin TIBC We have entered the orders in such a way that your [facility] cardiologist office should be able to draw those for you.  If you have any problems, please go to the lab in the basement of our building.  They are open form 8:00am to 5:00pm Monday through Friday.    Thank you for entrusting me with your care and for choosing [facility], Dr. Ileene Patrick

## 2020-06-13 NOTE — Progress Notes (Signed)
HPI :  59 year old male here for a follow-up visit for history of iron deficiency anemia, history of H. pylori gastritis.  I previously saw him in May of 2019 for iron deficiency anemia. He underwent an EGD and colonoscopy with me at that time. Remarkable findings included a normal colonoscopy, and EGD showed diffuse mild esophagitis (focal erosions - no obvious Barrett's), and H pylori gastritis. At that time his nexium was increased to BID dosing and he was given amoxicillin, clarithromycin, and flagyl to treat the H pylori which he completed. A follow-up H. pylori stool antigen was - May 2020.  His iron studies had normalized on oral iron and following treatment of H. pylori, however his anemia persisted with hemoglobin in 11-12 range.  I had referred him to see hematology in light of his persistent anemia despite normal iron levels, no new recommendations were made, additional lab work done without clear cause.  Given his history of esophagitis on 2 exams over the years he has continued Nexium 40 mg once to twice daily as needed.  He states this worked really well to control symptoms.  He does not have any heartburn that bothers him or reflux symptoms.  He is taking iron supplementation twice daily and denies any blood in his stools or problems with his bowels.  He is not using any NSAIDs routinely.  He does take a multivitamin daily.  Of note he suffers from erectile dysfunction, seeing urology for possible surgery to treat that.  No FH of colon cancer, no gastric / esophageal cancer.   EGD 02/28/2018 - 2cm HH, diffuse mild esophagus - erosions in the esophagus, residual food in the stomach, large duodenal diverticulum, biopsies positive for H pylori Colonoscopy 02/28/2018 - TI normal, few diverticuli, hemorrhoids, otherwise normal  EGD 09/21/2010 - candida esophagitis, erosive esophagitis with pyloric stenosis Colonoscopy 08/14/2010 - normal    Past Medical History:  Diagnosis Date  .  Anemia   . GERD (gastroesophageal reflux disease)   . History of Helicobacter pylori infection   . HTN (hypertension)   . hyperlipidemia   . Kidney stone   . Paroxysmal atrial fibrillation [facility])      Past Surgical History:  Procedure Laterality Date  . HERNIA REPAIR    . PENILE PROSTHESIS IMPLANT    . TONSILLECTOMY     Family History  Problem Relation Age of Onset  . Diabetes Mother   . Stroke Mother   . Heart disease Mother   . Diabetes Father   . Cancer Father        Prostate cancer  . Diabetes Sister   . Colon cancer Neg Hx   . Esophageal cancer Neg Hx   . Rectal cancer Neg Hx   . Stomach cancer Neg Hx    Social History   Tobacco Use  . Smoking status: Current Some Day Smoker    Types: Cigars  . Smokeless tobacco: Never Used  Vaping Use  . Vaping Use: Never used  Substance Use Topics  . Alcohol use: No  . Drug use: No   Current Outpatient Medications  Medication Sig Dispense Refill  . amLODipine (NORVASC) 5 MG tablet Take 1 tablet (5 mg total) by mouth daily. 90 tablet 3  . aspirin 81 MG chewable tablet Chew 81 mg by mouth daily.    Marland Kitchen esomeprazole (NEXIUM) 40 MG capsule Take 1 capsule (40 mg total) by mouth daily. 90 capsule 1  . ferrous sulfate 325 (65 FE) MG tablet Take 1  tablet (325 mg total) by mouth 2 (two) times daily. 180 tablet 1  . Hypromellose (ARTIFICIAL TEARS OP) Place 1 drop into both eyes daily as needed (dry eyes).    . metoprolol tartrate (LOPRESSOR) 50 MG tablet Take 1 tablet (50 mg total) by mouth 2 (two) times daily. 180 tablet 3  . Multiple Vitamins-Minerals (ONE-A-DAY 50 PLUS PO) Take 1 tablet by mouth daily.     . rosuvastatin (CRESTOR) 40 MG tablet Take 1 tablet (40 mg total) by mouth daily. 90 tablet 3   No current facility-administered medications for this visit.   No Known Allergies   Review of Systems: All systems reviewed and negative except where noted in HPI.   CBC Latest Ref Rng & Units 05/18/2019 02/10/2019 01/06/2019  WBC  4.0 - 10.5 K/uL 6.2 8.4 8.0  Hemoglobin 13.0 - 17.0 g/dL 11.3(L) 11.7(L) 12.3(L)  Hematocrit 39 - 52 % 34.6(L) 35.4(L) 36.8(L)  Platelets 150 - 400 K/uL 208 184.0 205.0   Lab Results  Component Value Date   IRON 106 05/18/2019   TIBC 253 05/18/2019   FERRITIN 131 05/18/2019     Physical Exam: BP 128/80   Pulse 76   Ht 6' (1.829 m)   Wt 231 lb 9.6 oz (105.1 kg)   BMI 31.41 kg/m  Constitutional: Pleasant,well-developed, male in no acute distress. Neurological: Alert and oriented to person place and time. Psychiatric: Normal mood and affect. Behavior is normal.   ASSESSMENT AND PLAN: 59 year old male here for reassessment of the following:  Iron deficiency / anemia / GERD - history of iron deficiency in the past, associated with H. pylori gastritis for which he was treated and H. pylori was eradicated.  He is asymptomatic.  Previously his iron deficiency was treated and iron stores normalized on oral iron but his anemia persisted.  Seen by hematology without other clear cause.  It has been over a year since his last blood work for this, will repeat a CBC with iron studies.  I will also check a testosterone level to make sure that is normal and not related to this in light of his other symptoms.  If his anemia persists despite normal iron and negative testosterone, will refer back to hematology to see if any further work-up is needed.  He will continue his present medications for now.  I did discuss long-term risk benefits of chronic PPI use, he feels benefits outweigh risks at this time, in light of history of esophagitis I think okay to continue for now.  Ileene Patrick, MD [facility]

## 2020-06-14 ENCOUNTER — Other Ambulatory Visit: Payer: Medicaid Other

## 2020-06-14 ENCOUNTER — Other Ambulatory Visit: Payer: Self-pay

## 2020-06-14 DIAGNOSIS — I10 Essential (primary) hypertension: Secondary | ICD-10-CM

## 2020-06-14 DIAGNOSIS — D509 Iron deficiency anemia, unspecified: Secondary | ICD-10-CM

## 2020-06-14 DIAGNOSIS — D649 Anemia, unspecified: Secondary | ICD-10-CM

## 2020-06-14 DIAGNOSIS — K219 Gastro-esophageal reflux disease without esophagitis: Secondary | ICD-10-CM

## 2020-06-14 DIAGNOSIS — E7800 Pure hypercholesterolemia, unspecified: Secondary | ICD-10-CM

## 2020-06-14 LAB — CBC WITH DIFFERENTIAL/PLATELET
Basophils Absolute: 0.1 10*3/uL (ref 0.0–0.2)
Basos: 1 %
EOS (ABSOLUTE): 0.2 10*3/uL (ref 0.0–0.4)
Eos: 3 %
Hematocrit: 35.8 % — ABNORMAL LOW (ref 37.5–51.0)
Hemoglobin: 11.5 g/dL — ABNORMAL LOW (ref 13.0–17.7)
Immature Grans (Abs): 0 10*3/uL (ref 0.0–0.1)
Immature Granulocytes: 0 %
Lymphocytes Absolute: 2.2 10*3/uL (ref 0.7–3.1)
Lymphs: 33 %
MCH: 28.4 pg (ref 26.6–33.0)
MCHC: 32.1 g/dL (ref 31.5–35.7)
MCV: 88 fL (ref 79–97)
Monocytes Absolute: 0.8 10*3/uL (ref 0.1–0.9)
Monocytes: 12 %
Neutrophils Absolute: 3.4 10*3/uL (ref 1.4–7.0)
Neutrophils: 51 %
Platelets: 207 10*3/uL (ref 150–450)
RBC: 4.05 x10E6/uL — ABNORMAL LOW (ref 4.14–5.80)
RDW: 13.9 % (ref 11.6–15.4)
WBC: 6.6 10*3/uL (ref 3.4–10.8)

## 2020-06-14 LAB — LIPID PANEL
Chol/HDL Ratio: 2.2 ratio (ref 0.0–5.0)
Cholesterol, Total: 131 mg/dL (ref 100–199)
HDL: 59 mg/dL (ref >39)
LDL Chol Calc (NIH): 60 mg/dL (ref 0–99)
Triglycerides: 53 mg/dL (ref 0–149)
VLDL Cholesterol Cal: 12 mg/dL (ref 5–40)

## 2020-06-14 LAB — TESTOSTERONE: Testosterone: 182 ng/dL — ABNORMAL LOW (ref 264–916)

## 2020-06-14 LAB — FERRITIN: Ferritin: 272 ng/mL (ref 30–400)

## 2020-06-14 LAB — HEPATIC FUNCTION PANEL
ALT: 19 IU/L (ref 0–44)
AST: 27 IU/L (ref 0–40)
Albumin: 4.1 g/dL (ref 3.8–4.9)
Alkaline Phosphatase: 49 IU/L (ref 48–121)
Bilirubin Total: 0.3 mg/dL (ref 0.0–1.2)
Bilirubin, Direct: 0.13 mg/dL (ref 0.00–0.40)
Total Protein: 6.6 g/dL (ref 6.0–8.5)

## 2020-06-14 LAB — IRON AND TIBC
Iron Saturation: 34 % (ref 15–55)
Iron: 81 ug/dL (ref 38–169)
Total Iron Binding Capacity: 235 ug/dL — ABNORMAL LOW (ref 250–450)
UIBC: 154 ug/dL (ref 111–343)

## 2020-06-28 ENCOUNTER — Other Ambulatory Visit: Payer: Medicaid Other

## 2020-08-11 ENCOUNTER — Other Ambulatory Visit: Payer: Self-pay | Admitting: Cardiovascular Disease

## 2020-08-11 MED ORDER — AMLODIPINE BESYLATE 5 MG PO TABS: 5.0000 mg | ORAL_TABLET | Freq: Every day | ORAL | 0 refills | Status: DC

## 2020-08-11 NOTE — Telephone Encounter (Signed)
90 day supply Amlodipine sent to [facility], [name], per pt request.  Pt has an appt 08/2020 with Dr. Clifton James.

## 2020-08-11 NOTE — Telephone Encounter (Signed)
°*  STAT* If patient is at the pharmacy, call can be transferred to refill team.   1. Which medications need to be refilled? (please list name of each medication and dose if known) amLODipine (NORVASC) 5 MG tablet  2. Which pharmacy/location (including street and city if local pharmacy) is medication to be sent to? [facility] - [place], [place] - [address]  3. Do they need a 30 day or 90 day supply? 90

## 2020-09-04 ENCOUNTER — Other Ambulatory Visit: Payer: Self-pay | Admitting: Gastroenterology

## 2020-09-19 ENCOUNTER — Ambulatory Visit: Payer: Medicaid Other | Admitting: Cardiovascular Disease

## 2020-10-05 ENCOUNTER — Other Ambulatory Visit: Payer: Self-pay | Admitting: Cardiovascular Disease

## 2020-10-31 NOTE — Progress Notes (Signed)
Cardiology Office Note    Date:  11/02/2020   ID:  Asuncion, Shibata 08/24/1961, MRN [id number]  PCP:  [facility]  Cardiologist: Verne Carrow, MD EPS: None  No chief complaint on file.   History of Present Illness:  [name] is a 60 y.o. male with history of PAF previously on Flecainide, no anticoagulation in the past bc of CHADSVASC=1, HTN, HLD. Cardiac cath 2009 by Dr. Donnie Aho normal Coronary arteries.  Last saw Dr. Clifton James 08/19/19 and doing well.   Patient comes in for f/u. Denies chest pain, palpitations, dyspnea, dizziness or presyncope. Getting testosterone shots and now has more energy. Doesn't think he's had any recurrent Afib.Patient walks 20 min some days.    Past Medical History:  Diagnosis Date  . Anemia   . GERD (gastroesophageal reflux disease)   . History of Helicobacter pylori infection   . HTN (hypertension)   . hyperlipidemia   . Kidney stone   . Paroxysmal atrial fibrillation [facility])     Past Surgical History:  Procedure Laterality Date  . HERNIA REPAIR    . PENILE PROSTHESIS IMPLANT    . TONSILLECTOMY      Current Medications: Current Meds  Medication Sig  . amLODipine (NORVASC) 5 MG tablet Take 1 tablet (5 mg total) by mouth daily.  Marland Kitchen aspirin 81 MG chewable tablet Chew 81 mg by mouth daily.  Marland Kitchen esomeprazole (NEXIUM) 40 MG capsule Take 1 capsule (40 mg total) by mouth daily.  . ferrous sulfate 325 (65 FE) MG tablet Take 1 tablet (325 mg total) by mouth 2 (two) times daily.  . Hypromellose (ARTIFICIAL TEARS OP) Place 1 drop into both eyes daily as needed (dry eyes).  . metoprolol tartrate (LOPRESSOR) 50 MG tablet Take 1 tablet (50 mg total) by mouth 2 (two) times daily. Please keep upcoming appt in January 2022 before anymore refills. Thank you  . Multiple Vitamins-Minerals (ONE-A-DAY 50 PLUS PO) Take 1 tablet by mouth daily.   . rosuvastatin (CRESTOR) 40 MG tablet Take 1 tablet (40 mg total) by mouth daily.      Allergies:   Patient has no known allergies.   Social History   Socioeconomic History  . Marital status: Divorced    Spouse name: Not on file  . Number of children: 0  . Years of education: Not on file  . Highest education level: Not on file  Occupational History  . Occupation: IT consultant: [employer]  Tobacco Use  . Smoking status: Current Some Day Smoker    Types: Cigars  . Smokeless tobacco: Never Used  Vaping Use  . Vaping Use: Never used  Substance and Sexual Activity  . Alcohol use: No  . Drug use: No  . Sexual activity: Not Currently  Other Topics Concern  . Not on file  Social History Narrative  . Not on file   Social Determinants of Health   Financial Resource Strain: Not on file  Food Insecurity: Not on file  Transportation Needs: Not on file  Physical Activity: Not on file  Stress: Not on file  Social Connections: Not on file     Family History:  The patient's family history includes Cancer in his father; Diabetes in his father, mother, and sister; Heart disease in his mother; Stroke in his mother.   ROS:   Please see the history of present illness.    ROS All other systems reviewed and are negative.   PHYSICAL  EXAM:   VS:  BP 136/82   Pulse 72   Ht 6' (1.829 m)   Wt 248 lb 3.2 oz (112.6 kg)   SpO2 95%   BMI 33.66 kg/m   Physical Exam  GEN: Well nourished, well developed, in no acute distress  Neck: no JVD, carotid bruits, or masses Cardiac:RRR; no murmurs, rubs, or gallops  Respiratory:  clear to auscultation bilaterally, normal work of breathing GI: soft, nontender, nondistended, + BS Ext: without cyanosis, clubbing, or edema, Good distal pulses bilaterally Neuro:  Alert and Oriented x 3 Psych: euthymic mood, full affect  Wt Readings from Last 3 Encounters:  11/02/20 248 lb 3.2 oz (112.6 kg)  06/13/20 231 lb 9.6 oz (105.1 kg)  08/19/19 234 lb 6.4 oz (106.3 kg)      Studies/Labs Reviewed:   EKG:  EKG is  ordered  today.  The ekg ordered today demonstrates NSR with minimal ST elevation laterally unchanged from prior tracings.  Recent Labs: 06/14/2020: ALT 19; Hemoglobin 11.5; Platelets 207   Lipid Panel    Component Value Date/Time   CHOL 131 06/14/2020 0739   TRIG 53 06/14/2020 0739   HDL 59 06/14/2020 0739   CHOLHDL 2.2 06/14/2020 0739   CHOLHDL 2.4 03/09/2016 0804   VLDL 11 03/09/2016 0804   LDLCALC 60 06/14/2020 0739    Additional studies/ records that were reviewed today include:   Cardiac cath 03/23/08: Dr. Donnie Aho  ANGIOGRAPHIC DATA: Left ventriculogram: Performed in the 30 degree RAO  projection. The aortic valve is normal. The mitral valve is normal.  The left ventricle appears normal in size. Estimated ejection fraction  is 55%. Coronary arteries arise and distribute normally. There is no  significant coronary artery calcification noted. The left main coronary  artery is normal. Left anterior descending is a large vessel and  contains no significant obstructive disease. The circumflex coronary  artery has a large intermediate branch that is free of disease. The  circumflex has two coronary arteries. He has two marginal arteries.  The right coronary artery is a dominant vessel that contains no  significant disease.    Echo July 2019: - Left ventricle: The cavity size was normal. Wall thickness was   increased in a pattern of moderate LVH. Systolic function was   normal. The estimated ejection fraction was in the range of 60%   to 65%. Wall motion was normal; there were no regional wall   motion abnormalities. Doppler parameters are consistent with   abnormal left ventricular relaxation (grade 1 diastolic   dysfunction). The E/e&' ratio is between 8-15, suggesting   indeterminate LV filling pressure. - Left atrium: The atrium was normal in size. - Right atrium: The atrium was mildly dilated. - Inferior vena cava: The vessel was normal in size. The   respirophasic diameter  changes were in the normal range (>= 50%),   consistent with normal central venous pressure.      Risk Assessment/Calculations:     CHA2DS2-VASc Score = 1  This indicates a 0.6% annual risk of stroke. The patient's score is based upon: CHF History: No HTN History: Yes Diabetes History: No Stroke History: No Vascular Disease History: No Age Score: 0 Gender Score: 0         ASSESSMENT:    1. Paroxysmal atrial fibrillation (HCC)   2. Essential hypertension   3. Other hyperlipidemia   4. Weight gain      PLAN:  In order of problems listed above:  PAF on  Flecainide, no anticoagulation in the past bc of CHADSVASC=1. 150 min exercise weekly  HTN with mod LVH on echo 2019 BP controlled amlodipine and metoprolol-will get him a BP cuff to monitor at home.  HLD LDL 60 05/2020 on crestor  Weight gain 17 lbs since august. Diet and exercise discussed.  Shared Decision Making/Informed Consent        Medication Adjustments/Labs and Tests Ordered: Current medicines are reviewed at length with the patient today.  Concerns regarding medicines are outlined above.  Medication changes, Labs and Tests ordered today are listed in the Patient Instructions below. Patient Instructions  Medication Instructions:  Your physician recommends that you continue on your current medications as directed. Please refer to the Current Medication list given to you today.  *If you need a refill on your cardiac medications before your next appointment, please call your pharmacy*   Lab Work: None If you have labs (blood work) drawn today and your tests are completely normal, you will receive your results only by: Marland Kitchen MyChart Message (if you have MyChart) OR . A paper copy in the mail If you have any lab test that is abnormal or we need to change your treatment, we will call you to review the results.   Follow-Up: At [facility], you and your health needs are our priority.  As part of our  continuing mission to provide you with exceptional heart care, we have created designated Provider Care Teams.  These Care Teams include your primary Cardiologist (physician) and Advanced Practice Providers (APPs -  Physician Assistants and Nurse Practitioners) who all work together to provide you with the care you need, when you need it.  We recommend signing up for the patient portal called "MyChart".  Sign up information is provided on this After Visit Summary.  MyChart is used to connect with patients for Virtual Visits (Telemedicine).  Patients are able to view lab/test results, encounter notes, upcoming appointments, etc.  Non-urgent messages can be sent to your provider as well.   To learn more about what you can do with MyChart, go to [email or web address].    Your next appointment:   1 year(s)  The format for your next appointment:   In Person  Provider:   You may see Verne Carrow, MD or one of the following Advanced Practice Providers on your designated Care Team:    Ronie Spies, PA-C  Jacolyn Reedy, PA-C    Other Instructions Your provider recommends that you maintain 150 minutes per week of moderate aerobic activity.   Heart-Healthy Eating Plan Heart-healthy meal planning includes:  Eating less unhealthy fats.  Eating more healthy fats.  Making other changes in your diet. Talk with your doctor or a diet specialist (dietitian) to create an eating plan that is right for you. What is my plan? Your doctor may recommend an eating plan that includes:  Total fat: ______% or less of total calories a day.  Saturated fat: ______% or less of total calories a day.  Cholesterol: less than _________mg a day. What are tips for following this plan? Cooking Avoid frying your food. Try to bake, boil, grill, or broil it instead. You can also reduce fat by:  Removing the skin from poultry.  Removing all visible fats from meats.  Steaming vegetables in water or  broth. Meal planning   At meals, divide your plate into four equal parts: ? Fill one-half of your plate with vegetables and green salads. ? Fill one-fourth of your plate with whole  grains. ? Fill one-fourth of your plate with lean protein foods.  Eat 4-5 servings of vegetables per day. A serving of vegetables is: ? 1 cup of raw or cooked vegetables. ? 2 cups of raw leafy greens.  Eat 4-5 servings of fruit per day. A serving of fruit is: ? 1 medium whole fruit. ?  cup of dried fruit. ?  cup of fresh, frozen, or canned fruit. ?  cup of 100% fruit juice.  Eat more foods that have soluble fiber. These are apples, broccoli, carrots, beans, peas, and barley. Try to get 20-30 g of fiber per day.  Eat 4-5 servings of nuts, legumes, and seeds per week: ? 1 serving of dried beans or legumes equals  cup after being cooked. ? 1 serving of nuts is  cup. ? 1 serving of seeds equals 1 tablespoon. General information  Eat more home-cooked food. Eat less restaurant, buffet, and fast food.  Limit or avoid alcohol.  Limit foods that are high in starch and sugar.  Avoid fried foods.  Lose weight if you are overweight.  Keep track of how much salt (sodium) you eat. This is important if you have high blood pressure. Ask your doctor to tell you more about this.  Try to add vegetarian meals each week. Fats  Choose healthy fats. These include olive oil and canola oil, flaxseeds, walnuts, almonds, and seeds.  Eat more omega-3 fats. These include salmon, mackerel, sardines, tuna, flaxseed oil, and ground flaxseeds. Try to eat fish at least 2 times each week.  Check food labels. Avoid foods with trans fats or high amounts of saturated fat.  Limit saturated fats. ? These are often found in animal products, such as meats, butter, and cream. ? These are also found in plant foods, such as palm oil, palm kernel oil, and coconut oil.  Avoid foods with partially hydrogenated oils in them. These  have trans fats. Examples are stick margarine, some tub margarines, cookies, crackers, and other baked goods. What foods can I eat? Fruits All fresh, canned (in natural juice), or frozen fruits. Vegetables Fresh or frozen vegetables (raw, steamed, roasted, or grilled). Green salads. Grains Most grains. Choose whole wheat and whole grains most of the time. Rice and pasta, including Geers rice and pastas made with whole wheat. Meats and other proteins Lean, well-trimmed beef, veal, pork, and lamb. Chicken and [place] without skin. All fish and shellfish. Wild duck, rabbit, pheasant, and venison. Egg whites or low-cholesterol egg substitutes. Dried beans, peas, lentils, and tofu. Seeds and most nuts. Dairy Low-fat or nonfat cheeses, including ricotta and mozzarella. Skim or 1% milk that is liquid, powdered, or evaporated. Buttermilk that is made with low-fat milk. Nonfat or low-fat yogurt. Fats and oils Non-hydrogenated (trans-free) margarines. Vegetable oils, including soybean, sesame, sunflower, olive, peanut, safflower, corn, canola, and cottonseed. Salad dressings or mayonnaise made with a vegetable oil. Beverages Mineral water. Coffee and tea. Diet carbonated beverages. Sweets and desserts Sherbet, gelatin, and fruit ice. Small amounts of dark chocolate. Limit all sweets and desserts. Seasonings and condiments All seasonings and condiments. The items listed above may not be a complete list of foods and drinks you can eat. Contact a dietitian for more options. What foods should I avoid? Fruits Canned fruit in heavy syrup. Fruit in cream or butter sauce. Fried fruit. Limit coconut. Vegetables Vegetables cooked in cheese, cream, or butter sauce. Fried vegetables. Grains Breads that are made with saturated or trans fats, oils, or whole milk. Croissants. Sweet  rolls. Donuts. High-fat crackers, such as cheese crackers. Meats and other proteins Fatty meats, such as hot dogs, ribs, sausage,  bacon, rib-eye roast or steak. High-fat deli meats, such as salami and bologna. Caviar. Domestic duck and goose. Organ meats, such as liver. Dairy Cream, sour cream, cream cheese, and creamed cottage cheese. Whole-milk cheeses. Whole or 2% milk that is liquid, evaporated, or condensed. Whole buttermilk. Cream sauce or high-fat cheese sauce. Yogurt that is made from whole milk. Fats and oils Meat fat, or shortening. Cocoa butter, hydrogenated oils, palm oil, coconut oil, palm kernel oil. Solid fats and shortenings, including bacon fat, salt pork, lard, and butter. Nondairy cream substitutes. Salad dressings with cheese or sour cream. Beverages Regular sodas and juice drinks with added sugar. Sweets and desserts Frosting. Pudding. Cookies. Cakes. Pies. Milk chocolate or white chocolate. Buttered syrups. Full-fat ice cream or ice cream drinks. The items listed above may not be a complete list of foods and drinks to avoid. Contact a dietitian for more information. Summary  Heart-healthy meal planning includes eating less unhealthy fats, eating more healthy fats, and making other changes in your diet.  Eat a balanced diet. This includes fruits and vegetables, low-fat or nonfat dairy, lean protein, nuts and legumes, whole grains, and heart-healthy oils and fats. This information is not intended to replace advice given to you by your health care provider. Make sure you discuss any questions you have with your health care provider. Document Revised: 12/19/2017 Document Reviewed: 11/22/2017 Elsevier Patient Education  2020 [place], Ermalinda Barrios, [place]  11/02/2020 10:15 AM    [facility] [address], [place], [place]  [address] Phone: [phone]; Fax: [phone]

## 2020-11-02 ENCOUNTER — Ambulatory Visit (INDEPENDENT_AMBULATORY_CARE_PROVIDER_SITE_OTHER): Payer: Medicaid Other | Admitting: Physician Assistant

## 2020-11-02 ENCOUNTER — Other Ambulatory Visit: Payer: Self-pay | Admitting: Cardiovascular Disease

## 2020-11-02 ENCOUNTER — Other Ambulatory Visit: Payer: Self-pay

## 2020-11-02 ENCOUNTER — Encounter: Payer: Self-pay | Admitting: Physician Assistant

## 2020-11-02 VITALS — BP 136/82 | HR 72 | Ht 72.0 in | Wt 248.2 lb

## 2020-11-02 DIAGNOSIS — E7849 Other hyperlipidemia: Secondary | ICD-10-CM

## 2020-11-02 DIAGNOSIS — R635 Abnormal weight gain: Secondary | ICD-10-CM | POA: Diagnosis not present

## 2020-11-02 DIAGNOSIS — I480 Paroxysmal atrial fibrillation: Secondary | ICD-10-CM

## 2020-11-02 DIAGNOSIS — I10 Essential (primary) hypertension: Secondary | ICD-10-CM

## 2020-11-02 NOTE — Patient Instructions (Signed)
Medication Instructions:  Your physician recommends that you continue on your current medications as directed. Please refer to the Current Medication list given to you today.  *If you need a refill on your cardiac medications before your next appointment, please call your pharmacy*   Lab Work: None If you have labs (blood work) drawn today and your tests are completely normal, you will receive your results only by: Marland Kitchen MyChart Message (if you have MyChart) OR . A paper copy in the mail If you have any lab test that is abnormal or we need to change your treatment, we will call you to review the results.   Follow-Up: At [facility], you and your health needs are our priority.  As part of our continuing mission to provide you with exceptional heart care, we have created designated Provider Care Teams.  These Care Teams include your primary Cardiologist (physician) and Advanced Practice Providers (APPs -  Physician Assistants and Nurse Practitioners) who all work together to provide you with the care you need, when you need it.  We recommend signing up for the patient portal called "MyChart".  Sign up information is provided on this After Visit Summary.  MyChart is used to connect with patients for Virtual Visits (Telemedicine).  Patients are able to view lab/test results, encounter notes, upcoming appointments, etc.  Non-urgent messages can be sent to your provider as well.   To learn more about what you can do with MyChart, go to [email or web address].    Your next appointment:   1 year(s)  The format for your next appointment:   In Person  Provider:   You may see Lauree Chandler, MD or one of the following Advanced Practice Providers on your designated Care Team:    Melina Copa, PA-C  Ermalinda Barrios, PA-C    Other Instructions Your provider recommends that you maintain 150 minutes per week of moderate aerobic activity.   Heart-Healthy Eating Plan Heart-healthy meal  planning includes:  Eating less unhealthy fats.  Eating more healthy fats.  Making other changes in your diet. Talk with your doctor or a diet specialist (dietitian) to create an eating plan that is right for you. What is my plan? Your doctor may recommend an eating plan that includes:  Total fat: ______% or less of total calories a day.  Saturated fat: ______% or less of total calories a day.  Cholesterol: less than _________mg a day. What are tips for following this plan? Cooking Avoid frying your food. Try to bake, boil, grill, or broil it instead. You can also reduce fat by:  Removing the skin from poultry.  Removing all visible fats from meats.  Steaming vegetables in water or broth. Meal planning   At meals, divide your plate into four equal parts: ? Fill one-half of your plate with vegetables and green salads. ? Fill one-fourth of your plate with whole grains. ? Fill one-fourth of your plate with lean protein foods.  Eat 4-5 servings of vegetables per day. A serving of vegetables is: ? 1 cup of raw or cooked vegetables. ? 2 cups of raw leafy greens.  Eat 4-5 servings of fruit per day. A serving of fruit is: ? 1 medium whole fruit. ?  cup of dried fruit. ?  cup of fresh, frozen, or canned fruit. ?  cup of 100% fruit juice.  Eat more foods that have soluble fiber. These are apples, broccoli, carrots, beans, peas, and barley. Try to get 20-30 g of fiber per day.  Eat 4-5 servings of nuts, legumes, and seeds per week: ? 1 serving of dried beans or legumes equals  cup after being cooked. ? 1 serving of nuts is  cup. ? 1 serving of seeds equals 1 tablespoon. General information  Eat more home-cooked food. Eat less restaurant, buffet, and fast food.  Limit or avoid alcohol.  Limit foods that are high in starch and sugar.  Avoid fried foods.  Lose weight if you are overweight.  Keep track of how much salt (sodium) you eat. This is important if you have  high blood pressure. Ask your doctor to tell you more about this.  Try to add vegetarian meals each week. Fats  Choose healthy fats. These include olive oil and canola oil, flaxseeds, walnuts, almonds, and seeds.  Eat more omega-3 fats. These include salmon, mackerel, sardines, tuna, flaxseed oil, and ground flaxseeds. Try to eat fish at least 2 times each week.  Check food labels. Avoid foods with trans fats or high amounts of saturated fat.  Limit saturated fats. ? These are often found in animal products, such as meats, butter, and cream. ? These are also found in plant foods, such as palm oil, palm kernel oil, and coconut oil.  Avoid foods with partially hydrogenated oils in them. These have trans fats. Examples are stick margarine, some tub margarines, cookies, crackers, and other baked goods. What foods can I eat? Fruits All fresh, canned (in natural juice), or frozen fruits. Vegetables Fresh or frozen vegetables (raw, steamed, roasted, or grilled). Green salads. Grains Most grains. Choose whole wheat and whole grains most of the time. Rice and pasta, including Lough rice and pastas made with whole wheat. Meats and other proteins Lean, well-trimmed beef, veal, pork, and lamb. Chicken and [place] without skin. All fish and shellfish. Wild duck, rabbit, pheasant, and venison. Egg whites or low-cholesterol egg substitutes. Dried beans, peas, lentils, and tofu. Seeds and most nuts. Dairy Low-fat or nonfat cheeses, including ricotta and mozzarella. Skim or 1% milk that is liquid, powdered, or evaporated. Buttermilk that is made with low-fat milk. Nonfat or low-fat yogurt. Fats and oils Non-hydrogenated (trans-free) margarines. Vegetable oils, including soybean, sesame, sunflower, olive, peanut, safflower, corn, canola, and cottonseed. Salad dressings or mayonnaise made with a vegetable oil. Beverages Mineral water. Coffee and tea. Diet carbonated beverages. Sweets and  desserts Sherbet, gelatin, and fruit ice. Small amounts of dark chocolate. Limit all sweets and desserts. Seasonings and condiments All seasonings and condiments. The items listed above may not be a complete list of foods and drinks you can eat. Contact a dietitian for more options. What foods should I avoid? Fruits Canned fruit in heavy syrup. Fruit in cream or butter sauce. Fried fruit. Limit coconut. Vegetables Vegetables cooked in cheese, cream, or butter sauce. Fried vegetables. Grains Breads that are made with saturated or trans fats, oils, or whole milk. Croissants. Sweet rolls. Donuts. High-fat crackers, such as cheese crackers. Meats and other proteins Fatty meats, such as hot dogs, ribs, sausage, bacon, rib-eye roast or steak. High-fat deli meats, such as salami and bologna. Caviar. Domestic duck and goose. Organ meats, such as liver. Dairy Cream, sour cream, cream cheese, and creamed cottage cheese. Whole-milk cheeses. Whole or 2% milk that is liquid, evaporated, or condensed. Whole buttermilk. Cream sauce or high-fat cheese sauce. Yogurt that is made from whole milk. Fats and oils Meat fat, or shortening. Cocoa butter, hydrogenated oils, palm oil, coconut oil, palm kernel oil. Solid fats and shortenings, including bacon fat,  salt pork, lard, and butter. Nondairy cream substitutes. Salad dressings with cheese or sour cream. Beverages Regular sodas and juice drinks with added sugar. Sweets and desserts Frosting. Pudding. Cookies. Cakes. Pies. Milk chocolate or white chocolate. Buttered syrups. Full-fat ice cream or ice cream drinks. The items listed above may not be a complete list of foods and drinks to avoid. Contact a dietitian for more information. Summary  Heart-healthy meal planning includes eating less unhealthy fats, eating more healthy fats, and making other changes in your diet.  Eat a balanced diet. This includes fruits and vegetables, low-fat or nonfat dairy, lean  protein, nuts and legumes, whole grains, and heart-healthy oils and fats. This information is not intended to replace advice given to you by your health care provider. Make sure you discuss any questions you have with your health care provider. Document Revised: 12/19/2017 Document Reviewed: 11/22/2017 Elsevier Patient Education  2020 [employer].

## 2021-02-13 ENCOUNTER — Other Ambulatory Visit: Payer: Self-pay | Admitting: Cardiovascular Disease

## 2021-02-14 ENCOUNTER — Telehealth: Payer: Self-pay | Admitting: Cardiovascular Disease

## 2021-02-14 NOTE — Telephone Encounter (Signed)
*  STAT* If patient is at the pharmacy, call can be transferred to refill team.   1. Which medications need to be refilled? (please list name of each medication and dose if known) rosuvastatin (CRESTOR) 40 MG tablet  2. Which pharmacy/location (including street and city if local pharmacy) is medication to be sent to? [facility] - [place], [place] - [address]  3. Do they need a 30 day or 90 day supply? 90  Patient stated that he only have 6 pills left

## 2021-02-15 NOTE — Telephone Encounter (Signed)
Pt's medication was sent to pt's pharmacy as requested. Confirmation received.  °

## 2021-03-10 ENCOUNTER — Other Ambulatory Visit: Payer: Self-pay | Admitting: Gastroenterology

## 2021-04-17 ENCOUNTER — Other Ambulatory Visit: Payer: Self-pay | Admitting: Gastroenterology

## 2021-04-18 ENCOUNTER — Encounter (INDEPENDENT_AMBULATORY_CARE_PROVIDER_SITE_OTHER): Payer: Self-pay

## 2021-04-18 ENCOUNTER — Other Ambulatory Visit: Payer: Medicaid Other

## 2021-04-18 ENCOUNTER — Other Ambulatory Visit: Payer: Self-pay

## 2021-04-20 ENCOUNTER — Other Ambulatory Visit: Payer: Medicaid Other

## 2021-07-12 ENCOUNTER — Telehealth: Payer: Self-pay | Admitting: Physician Assistant

## 2021-07-12 NOTE — Telephone Encounter (Signed)
I called and spoke to [name]. He wanted [place] to know that he seen his PCP on 9/7 and she increased his Amlodipine due to some high BP readings. Advised [name] to bring current medications or list with him to his next appt.

## 2021-07-12 NOTE — Telephone Encounter (Signed)
Pt is calling in regards to obtaining his EKG results from  the 11/12/2020 with Herma Carson

## 2021-07-29 DEATH — deceased

## 2021-11-02 NOTE — Progress Notes (Deleted)
Cardiology Office Note    Date:  11/02/2021   ID:  Roy, Lyons 09/29/1961, MRN [id number]   PCP:  [facility], [facility]  Cardiologist:  Verne Carrow, MD *** Advanced Practice Provider:  No care team member to display Electrophysiologist:  None   [id number]}   No chief complaint on file.   History of Present Illness:  [name] is a 61 y.o. male  with history of PAF previously on Flecainide, no anticoagulation in the past bc of CHADSVASC=1, HTN, HLD. Cardiac cath 2009 by Dr. Donnie Aho normal Coronary arteries.  I saw the patient 11/02/20 and doing well but had gained 17 lbs.       Past Medical History:  Diagnosis Date   Anemia    GERD (gastroesophageal reflux disease)    History of Helicobacter pylori infection    HTN (hypertension)    hyperlipidemia    Kidney stone    Paroxysmal atrial fibrillation [facility])     Past Surgical History:  Procedure Laterality Date   HERNIA REPAIR     PENILE PROSTHESIS IMPLANT     TONSILLECTOMY      Current Medications: No outpatient medications have been marked as taking for the 11/08/21 encounter (Appointment) with Dyann Kief, PA-C.     Allergies:   Patient has no known allergies.   Social History   Socioeconomic History   Marital status: Divorced    Spouse name: Not on file   Number of children: 0   Years of education: Not on file   Highest education level: Not on file  Occupational History   Occupation: IT consultant: [employer]  Tobacco Use   Smoking status: Some Days    Types: Cigars   Smokeless tobacco: Never  Vaping Use   Vaping Use: Never used  Substance and Sexual Activity   Alcohol use: No   Drug use: No   Sexual activity: Not Currently  Other Topics Concern   Not on file  Social History Narrative   Not on file   Social Determinants of Health   Financial Resource Strain: Not on file  Food Insecurity: Not on file   Transportation Needs: Not on file  Physical Activity: Not on file  Stress: Not on file  Social Connections: Not on file     Family History:  The patient's ***family history includes Cancer in his father; Diabetes in his father, mother, and sister; Heart disease in his mother; Stroke in his mother.   ROS:   Please see the history of present illness.    ROS All other systems reviewed and are negative.   PHYSICAL EXAM:   VS:  There were no vitals taken for this visit.  Physical Exam  GEN: Well nourished, well developed, in no acute distress  HEENT: normal  Neck: no JVD, carotid bruits, or masses Cardiac:RRR; no murmurs, rubs, or gallops  Respiratory:  clear to auscultation bilaterally, normal work of breathing GI: soft, nontender, nondistended, + BS Ext: without cyanosis, clubbing, or edema, Good distal pulses bilaterally MS: no deformity or atrophy  Skin: warm and dry, no rash Neuro:  Alert and Oriented x 3, Strength and sensation are intact Psych: euthymic mood, full affect  Wt Readings from Last 3 Encounters:  11/02/20 248 lb 3.2 oz (112.6 kg)  06/13/20 231 lb 9.6 oz (105.1 kg)  08/19/19 234 lb 6.4 oz (106.3 kg)      Studies/Labs Reviewed:  EKG:  EKG is*** ordered today.  The ekg ordered today demonstrates ***  Recent Labs: No results found for requested labs within last 8760 hours.   Lipid Panel    Component Value Date/Time   CHOL 131 06/14/2020 0739   TRIG 53 06/14/2020 0739   HDL 59 06/14/2020 0739   CHOLHDL 2.2 06/14/2020 0739   CHOLHDL 2.4 03/09/2016 0804   VLDL 11 03/09/2016 0804   LDLCALC 60 06/14/2020 0739    Additional studies/ records that were reviewed today include:  Cardiac cath 03/23/08: Dr. Donnie Aho  ANGIOGRAPHIC DATA: Left ventriculogram: Performed in the 30 degree RAO  projection. The aortic valve is normal. The mitral valve is normal.  The left ventricle appears normal in size. Estimated ejection fraction  is 55%. Coronary arteries arise  and distribute normally. There is no  significant coronary artery calcification noted. The left main coronary  artery is normal. Left anterior descending is a large vessel and  contains no significant obstructive disease. The circumflex coronary  artery has a large intermediate branch that is free of disease. The  circumflex has two coronary arteries. He has two marginal arteries.  The right coronary artery is a dominant vessel that contains no  significant disease.    Echo July 2019: - Left ventricle: The cavity size was normal. Wall thickness was   increased in a pattern of moderate LVH. Systolic function was   normal. The estimated ejection fraction was in the range of 60%   to 65%. Wall motion was normal; there were no regional wall   motion abnormalities. Doppler parameters are consistent with   abnormal left ventricular relaxation (grade 1 diastolic   dysfunction). The E/e&' ratio is between 8-15, suggesting   indeterminate LV filling pressure. - Left atrium: The atrium was normal in size. - Right atrium: The atrium was mildly dilated. - Inferior vena cava: The vessel was normal in size. The   respirophasic diameter changes were in the normal range (>= 50%),   consistent with normal central venous pressure.        PAF on Flecainide, no anticoagulation in the past bc of CHADSVASC=1. 150 min exercise weekly   HTN with mod LVH on echo 2019 BP controlled amlodipine and metoprolol-will get him a BP cuff to monitor at home.   HLD LDL 60 05/2020 on crestor   Weight gain 17 lbs since august. Diet and exercise discussed.   Risk Assessment/Calculations:   {Does this patient have ATRIAL FIBRILLATION?[phone]}     ASSESSMENT:    No diagnosis found.   PLAN:  In order of problems listed above:    Shared Decision Making/Informed Consent   {Are you ordering a CV Procedure (e.g. stress test, cath, DCCV, TEE, etc)?   Press F2        :[id number]}    Medication  Adjustments/Labs and Tests Ordered: Current medicines are reviewed at length with the patient today.  Concerns regarding medicines are outlined above.  Medication changes, Labs and Tests ordered today are listed in the Patient Instructions below. There are no Patient Instructions on file for this visit.   Elson Clan, PA-C  11/02/2021 8:28 AM    [facility] [address] [place], [place], [place]  [address] Phone: [phone]; Fax: [phone]

## 2021-11-08 ENCOUNTER — Ambulatory Visit: Payer: Medicaid Other | Admitting: Physician Assistant

## 2021-11-08 DIAGNOSIS — E7849 Other hyperlipidemia: Secondary | ICD-10-CM

## 2021-11-08 DIAGNOSIS — I480 Paroxysmal atrial fibrillation: Secondary | ICD-10-CM

## 2021-11-08 DIAGNOSIS — I10 Essential (primary) hypertension: Secondary | ICD-10-CM

## 2022-01-08 ENCOUNTER — Encounter: Payer: Self-pay | Admitting: Gastroenterology
